# Patient Record
Sex: Female | Born: 1945
Health system: Southern US, Community
[De-identification: ages and names within clinical notes are randomized; demographics above are authoritative.]

## PROBLEM LIST (undated history)

## (undated) DIAGNOSIS — T7840XA Allergy, unspecified, initial encounter: Secondary | ICD-10-CM

## (undated) DIAGNOSIS — I10 Essential (primary) hypertension: Secondary | ICD-10-CM

## (undated) DIAGNOSIS — M1990 Unspecified osteoarthritis, unspecified site: Secondary | ICD-10-CM

## (undated) DIAGNOSIS — R112 Nausea with vomiting, unspecified: Secondary | ICD-10-CM

## (undated) DIAGNOSIS — K219 Gastro-esophageal reflux disease without esophagitis: Secondary | ICD-10-CM

## (undated) DIAGNOSIS — T783XXA Angioneurotic edema, initial encounter: Secondary | ICD-10-CM

## (undated) DIAGNOSIS — M810 Age-related osteoporosis without current pathological fracture: Secondary | ICD-10-CM

## (undated) DIAGNOSIS — L509 Urticaria, unspecified: Secondary | ICD-10-CM

## (undated) DIAGNOSIS — Z9889 Other specified postprocedural states: Secondary | ICD-10-CM

## (undated) HISTORY — DX: Other specified postprocedural states: R11.2

## (undated) HISTORY — PX: CATARACT EXTRACTION, BILATERAL: SHX1313

## (undated) HISTORY — DX: Angioneurotic edema, initial encounter: T78.3XXA

## (undated) HISTORY — DX: Other specified postprocedural states: Z98.890

## (undated) HISTORY — PX: KNEE SURGERY: SHX244

## (undated) HISTORY — DX: Essential (primary) hypertension: I10

## (undated) HISTORY — DX: Unspecified osteoarthritis, unspecified site: M19.90

## (undated) HISTORY — PX: ELBOW SURGERY: SHX618

## (undated) HISTORY — PX: COLONOSCOPY: SHX174

## (undated) HISTORY — DX: Age-related osteoporosis without current pathological fracture: M81.0

## (undated) HISTORY — DX: Urticaria, unspecified: L50.9

## (undated) HISTORY — DX: Allergy, unspecified, initial encounter: T78.40XA

## (undated) HISTORY — DX: Gastro-esophageal reflux disease without esophagitis: K21.9

## (undated) HISTORY — PX: OTHER SURGICAL HISTORY: SHX169

---

## 1998-01-05 ENCOUNTER — Ambulatory Visit (HOSPITAL_COMMUNITY): Admission: RE | Admit: 1998-01-05 | Discharge: 1998-01-05 | Payer: Self-pay | Admitting: Family Medicine

## 1998-01-05 ENCOUNTER — Encounter: Payer: Self-pay | Admitting: Family Medicine

## 2000-08-11 ENCOUNTER — Other Ambulatory Visit: Admission: RE | Admit: 2000-08-11 | Discharge: 2000-08-11 | Payer: Self-pay | Admitting: Obstetrics and Gynecology

## 2000-08-30 ENCOUNTER — Encounter: Admission: RE | Admit: 2000-08-30 | Discharge: 2000-11-28 | Payer: Self-pay | Admitting: Gynecology

## 2000-11-28 ENCOUNTER — Ambulatory Visit (HOSPITAL_COMMUNITY): Admission: RE | Admit: 2000-11-28 | Discharge: 2000-11-28 | Payer: Self-pay | Admitting: Gastroenterology

## 2000-11-28 ENCOUNTER — Encounter: Payer: Self-pay | Admitting: Gastroenterology

## 2000-11-30 ENCOUNTER — Ambulatory Visit (HOSPITAL_COMMUNITY): Admission: RE | Admit: 2000-11-30 | Discharge: 2000-11-30 | Payer: Self-pay | Admitting: Gastroenterology

## 2000-11-30 ENCOUNTER — Encounter: Payer: Self-pay | Admitting: Gastroenterology

## 2001-08-31 ENCOUNTER — Other Ambulatory Visit: Admission: RE | Admit: 2001-08-31 | Discharge: 2001-08-31 | Payer: Self-pay | Admitting: Obstetrics and Gynecology

## 2002-03-20 ENCOUNTER — Ambulatory Visit (HOSPITAL_COMMUNITY): Admission: RE | Admit: 2002-03-20 | Discharge: 2002-03-20 | Payer: Self-pay | Admitting: Family Medicine

## 2002-03-20 ENCOUNTER — Encounter: Payer: Self-pay | Admitting: Family Medicine

## 2002-09-02 ENCOUNTER — Other Ambulatory Visit: Admission: RE | Admit: 2002-09-02 | Discharge: 2002-09-02 | Payer: Self-pay | Admitting: Obstetrics and Gynecology

## 2003-09-04 ENCOUNTER — Other Ambulatory Visit: Admission: RE | Admit: 2003-09-04 | Discharge: 2003-09-04 | Payer: Self-pay | Admitting: Obstetrics and Gynecology

## 2004-09-08 ENCOUNTER — Other Ambulatory Visit: Admission: RE | Admit: 2004-09-08 | Discharge: 2004-09-08 | Payer: Self-pay | Admitting: Obstetrics and Gynecology

## 2005-09-09 ENCOUNTER — Other Ambulatory Visit: Admission: RE | Admit: 2005-09-09 | Discharge: 2005-09-09 | Payer: Self-pay | Admitting: Obstetrics and Gynecology

## 2006-09-12 ENCOUNTER — Other Ambulatory Visit: Admission: RE | Admit: 2006-09-12 | Discharge: 2006-09-12 | Payer: Self-pay | Admitting: Obstetrics and Gynecology

## 2006-09-26 ENCOUNTER — Ambulatory Visit: Payer: Self-pay | Admitting: Gastroenterology

## 2006-10-11 ENCOUNTER — Ambulatory Visit: Payer: Self-pay | Admitting: Gastroenterology

## 2007-09-14 ENCOUNTER — Other Ambulatory Visit: Admission: RE | Admit: 2007-09-14 | Discharge: 2007-09-14 | Payer: Self-pay | Admitting: Obstetrics and Gynecology

## 2008-09-22 ENCOUNTER — Encounter: Payer: Self-pay | Admitting: Obstetrics and Gynecology

## 2008-09-22 ENCOUNTER — Other Ambulatory Visit: Admission: RE | Admit: 2008-09-22 | Discharge: 2008-09-22 | Payer: Self-pay | Admitting: Obstetrics and Gynecology

## 2008-09-22 ENCOUNTER — Ambulatory Visit: Payer: Self-pay | Admitting: Obstetrics and Gynecology

## 2009-09-23 ENCOUNTER — Other Ambulatory Visit: Admission: RE | Admit: 2009-09-23 | Discharge: 2009-09-23 | Payer: Self-pay | Admitting: Obstetrics and Gynecology

## 2009-09-23 ENCOUNTER — Ambulatory Visit: Payer: Self-pay | Admitting: Obstetrics and Gynecology

## 2009-09-30 ENCOUNTER — Ambulatory Visit: Payer: Self-pay | Admitting: Obstetrics and Gynecology

## 2010-09-03 NOTE — Procedures (Signed)
[place]. [facility]  Patient:    Kelly Quinn, Kelly Quinn                      MRN: [id number] Proc. Date: 11/28/00 Adm. Date:  [id number] Attending:  Louie Bun CC:         Stacie Acres. Cliffton Asters, M.D.   Procedure Report  PROCEDURE:  Colonoscopy.  INDICATIONS FOR PROCEDURE:  A 65 year old patient with a family history of colon polyps in a first degree relative.  DESCRIPTION OF PROCEDURE:  The patient was placed in the left lateral decubitus position and placed on the pulse monitor with continuous low-flow oxygen delivered by nasal cannula.  She was sedated with 70 mg IV Demerol and 7 mg IV Versed.  The Olympus video colonoscope was inserted into the rectum and advanced as far as possible.  With the scope inserted completely to its entire length, I was unable to reach the cecum despite multiple torquing maneuvers, abdominal pressure, and position change.  It was unclear, but I felt that the area of most proximal visualization was somewhere in the proximal transverse colon.  That area as well as the remaining transverse, descending, sigmoid, and rectum appeared normal with no polyps, masses, diverticula, or other mucosal abnormalities.  The rectum likewise appeared normal, and retroflex view of the anus revealed no obvious internal hemorrhoids.  The colonoscope was then withdrawn and the patient returned to the recovery room in stable condition.  She tolerated the procedure well, and there were no immediate complications.  IMPRESSION:  Normal incomplete colonoscopy to approximately the mid- to proximal transverse colon.  PLAN:  Will obtain barium enema to visualize the right colon. DD:  11/28/00 TD:  11/28/00 Job: [id number]

## 2010-09-27 ENCOUNTER — Encounter (INDEPENDENT_AMBULATORY_CARE_PROVIDER_SITE_OTHER): Payer: Federal, State, Local not specified - PPO | Admitting: Obstetrics and Gynecology

## 2010-09-27 ENCOUNTER — Other Ambulatory Visit: Payer: Self-pay | Admitting: Obstetrics and Gynecology

## 2010-09-27 ENCOUNTER — Other Ambulatory Visit (HOSPITAL_COMMUNITY)
Admission: RE | Admit: 2010-09-27 | Discharge: 2010-09-27 | Disposition: A | Discharge status: Home / Self Care | Payer: Federal, State, Local not specified - PPO | Source: Ambulatory Visit | Attending: Obstetrics and Gynecology | Admitting: Obstetrics and Gynecology

## 2010-09-27 DIAGNOSIS — Z01419 Encounter for gynecological examination (general) (routine) without abnormal findings: Secondary | ICD-10-CM

## 2010-09-27 DIAGNOSIS — Z124 Encounter for screening for malignant neoplasm of cervix: Secondary | ICD-10-CM | POA: Insufficient documentation

## 2010-09-27 DIAGNOSIS — Z13220 Encounter for screening for lipoid disorders: Secondary | ICD-10-CM

## 2010-09-28 DIAGNOSIS — N760 Acute vaginitis: Secondary | ICD-10-CM | POA: Insufficient documentation

## 2010-10-11 DIAGNOSIS — Z1211 Encounter for screening for malignant neoplasm of colon: Secondary | ICD-10-CM

## 2011-07-13 ENCOUNTER — Encounter: Payer: Self-pay | Admitting: Gastroenterology

## 2011-08-01 ENCOUNTER — Encounter: Payer: Self-pay | Admitting: Gastroenterology

## 2011-08-01 ENCOUNTER — Telehealth: Payer: Self-pay | Admitting: *Deleted

## 2011-08-01 NOTE — Telephone Encounter (Signed)
Pt is calling to have her bone density scheduled, she will be going to [facility]. Pt will need order signed. Pt said that the diag. Should be arthritis? Please advise

## 2011-08-01 NOTE — Telephone Encounter (Signed)
Order faxed.

## 2011-08-01 NOTE — Telephone Encounter (Signed)
We should get a fax from [place] and I will sign it.

## 2011-08-01 NOTE — Telephone Encounter (Signed)
Arthritis is not a reason to do a bone density. RU sure you her correctly?

## 2011-08-01 NOTE — Telephone Encounter (Signed)
I spoke with pt and she said the wrong word, it should be osteoporosis. Pt said that she wrote a lot of information down that day.

## 2011-08-26 ENCOUNTER — Encounter: Payer: Self-pay | Admitting: Obstetrics and Gynecology

## 2011-09-09 ENCOUNTER — Ambulatory Visit
Admission: RE | Admit: 2011-09-09 | Discharge: 2011-09-09 | Disposition: A | Discharge status: Home / Self Care | Payer: PRIVATE HEALTH INSURANCE | Source: Ambulatory Visit | Attending: Cardiology | Admitting: Cardiology

## 2011-09-09 ENCOUNTER — Other Ambulatory Visit: Payer: Self-pay | Admitting: Cardiology

## 2011-09-15 ENCOUNTER — Encounter: Payer: Self-pay | Admitting: Gastroenterology

## 2011-09-20 ENCOUNTER — Ambulatory Visit (AMBULATORY_SURGERY_CENTER): Payer: PRIVATE HEALTH INSURANCE

## 2011-09-20 VITALS — Ht 67.5 in | Wt 196.9 lb

## 2011-09-20 DIAGNOSIS — Z8371 Family history of colonic polyps: Secondary | ICD-10-CM

## 2011-09-20 DIAGNOSIS — Z1211 Encounter for screening for malignant neoplasm of colon: Secondary | ICD-10-CM

## 2011-09-20 MED ORDER — PEG-KCL-NACL-NASULF-NA ASC-C 100 G PO SOLR: 1.0000 | Freq: Once | ORAL | Status: AC

## 2011-09-28 ENCOUNTER — Encounter: Payer: Self-pay | Admitting: Obstetrics and Gynecology

## 2011-09-28 ENCOUNTER — Ambulatory Visit (INDEPENDENT_AMBULATORY_CARE_PROVIDER_SITE_OTHER): Payer: PRIVATE HEALTH INSURANCE | Admitting: Obstetrics and Gynecology

## 2011-09-28 VITALS — BP 124/74 | Ht 67.5 in | Wt 194.0 lb

## 2011-09-28 DIAGNOSIS — Z01419 Encounter for gynecological examination (general) (routine) without abnormal findings: Secondary | ICD-10-CM

## 2011-09-28 NOTE — Progress Notes (Signed)
Patient came to see me today for her annual GYN exam. She is doing well without HRT. She is having no vaginal bleeding. She is having no pelvic pain. She had normal mammogram this year but we did not get the report. She will get me the report. Her bone density showed osteopenia without an elevated FRAX risk. She had some additional bone loss. She has had no fractures. She takes calcium and vitamin D. She had her lab work done by Dr. Donnie Aho when she was having chest pain. She had GERD rather than coronary artery disease. She takes Famvir for HSV 1. She uses boric acid when necessary for vaginitis. She lost her brother due to DVT this year and a sister due to Alzheimer's. She has never had a normal Pap smear.  Physical examination: Kennon Portela present. HEENT within normal limits. Neck: Thyroid not large. No masses. Supraclavicular nodes: not enlarged. Breasts: Examined in both sitting and lying  position. No skin changes and no masses. Abdomen: Soft no guarding rebound or masses or hernia. Pelvic: External: Within normal limits. BUS: Within normal limits. Vaginal:within normal limits. Good estrogen effect. No evidence of cystocele rectocele or enterocele. Cervix: clean. Uterus: Normal size and shape. Adnexa: No masses. Rectovaginal exam: Confirmatory and negative. Extremities: Within normal limits.  Assessment: #1. Osteopenia #2. Recurrent vaginitis  Plan: Patient to me her mammogram report. Continue periodic bone densities. Continue boric acid when necessary.

## 2011-09-29 LAB — URINALYSIS W MICROSCOPIC + REFLEX CULTURE
Bacteria, UA: NONE SEEN
Bilirubin Urine: NEGATIVE
Crystals: NONE SEEN
Glucose, UA: NEGATIVE mg/dL
Ketones, ur: NEGATIVE mg/dL
Specific Gravity, Urine: 1.013 (ref 1.005–1.030)
Urobilinogen, UA: 0.2 mg/dL (ref 0.0–1.0)

## 2011-10-03 ENCOUNTER — Encounter: Payer: Self-pay | Admitting: Obstetrics and Gynecology

## 2011-10-04 ENCOUNTER — Ambulatory Visit (AMBULATORY_SURGERY_CENTER): Payer: PRIVATE HEALTH INSURANCE | Admitting: Gastroenterology

## 2011-10-04 ENCOUNTER — Encounter: Payer: Self-pay | Admitting: Gastroenterology

## 2011-10-04 VITALS — BP 152/79 | HR 82 | Temp 99.2°F | Resp 20 | Ht 67.5 in | Wt 196.0 lb

## 2011-10-04 DIAGNOSIS — Z8371 Family history of colonic polyps: Secondary | ICD-10-CM

## 2011-10-04 DIAGNOSIS — Z1211 Encounter for screening for malignant neoplasm of colon: Secondary | ICD-10-CM

## 2011-10-04 MED ORDER — SODIUM CHLORIDE 0.9 % IV SOLN: 500.0000 mL | INTRAVENOUS | Status: DC

## 2011-10-04 NOTE — Op Note (Signed)
[facility] [address] [place], [place]  [address]  COLONOSCOPY PROCEDURE REPORT  PATIENT:  Kelly Quinn, Kelly Quinn  MR#:  [id number] BIRTHDATE:  [birth year], 65 yrs. old  GENDER:  female ENDOSCOPIST:  Judie Petit T. Russella Dar, MD, [facility]  PROCEDURE DATE:  10/04/2011 PROCEDURE:  Colonoscopy 19147 ASA CLASS:  Class II INDICATIONS:  1) Elevated Risk Screening  2) family hx of polyps: brother and sister MEDICATIONS:   MAC sedation, administered by CRNA, propofol (Diprivan) 300 mg IV DESCRIPTION OF PROCEDURE:   After the risks benefits and alternatives of the procedure were thoroughly explained, informed consent was obtained.  Digital rectal exam was performed and revealed no abnormalities. The LB CF-[id number] endoscope was introduced through the anus and advanced to the cecum, which was identified by both the appendix and ileocecal valve, with a tortuous colon. The quality of the prep was good, using MoviPrep. The instrument was then slowly withdrawn as the colon was fully examined. <<PROCEDUREIMAGES>> FINDINGS:  A normal appearing cecum, ileocecal valve, and appendiceal orifice were identified. The ascending, hepatic flexure, transverse, splenic flexure, descending, sigmoid colon, and rectum appeared unremarkable.   Retroflexed views in the rectum revealed internal hemorrhoids, small.  The time to cecum = 8.67  minutes. The scope was then withdrawn (time =  8.5  min) from the patient and the procedure completed.  COMPLICATIONS:  None  ENDOSCOPIC IMPRESSION: 1) Normal colon 2) Internal hemorrhoids  RECOMMENDATIONS: 1) Repeat Colonoscopy in 5 years.  Venita Lick. Russella Dar, MD, [facility]  CC:  Holley Bouche, MD     Carmelina Peal, MD  n. Rosalie DoctorVenita Lick. Eugenio Dollins at 10/04/2011 09:30 AM  Shearon Stalls, [phone]

## 2011-10-04 NOTE — Patient Instructions (Addendum)
YOU HAD AN ENDOSCOPIC PROCEDURE TODAY AT THE [facility]: Refer to the procedure report that was given to you for any specific questions about what was found during the examination.  If the procedure report does not answer your questions, please call your gastroenterologist to clarify.  If you requested that your care partner not be given the details of your procedure findings, then the procedure report has been included in a sealed envelope for you to review at your convenience later.  YOU SHOULD EXPECT: Some feelings of bloating in the abdomen. Passage of more gas than usual.  Walking can help get rid of the air that was put into your GI tract during the procedure and reduce the bloating. If you had a lower endoscopy (such as a colonoscopy or flexible sigmoidoscopy) you may notice spotting of blood in your stool or on the toilet paper. If you underwent a bowel prep for your procedure, then you may not have a normal bowel movement for a few days.  DIET: Your first meal following the procedure should be a light meal and then it is ok to progress to your normal diet.  A half-sandwich or bowl of soup is an example of a good first meal.  Heavy or fried foods are harder to digest and may make you feel nauseous or bloated.  Likewise meals heavy in dairy and vegetables can cause extra gas to form and this can also increase the bloating.  Drink plenty of fluids but you should avoid alcoholic beverages for 24 hours.  ACTIVITY: Your care partner should take you home directly after the procedure.  You should plan to take it easy, moving slowly for the rest of the day.  You can resume normal activity the day after the procedure however you should NOT DRIVE or use heavy machinery for 24 hours (because of the sedation medicines used during the test).    SYMPTOMS TO REPORT IMMEDIATELY: A gastroenterologist can be reached at any hour.  During normal business hours, 8:30 AM to 5:00 PM Monday through Friday,  call [phone].  After hours and on weekends, please call the GI answering service at [phone] who will take a message and have the physician on call contact you.   Following lower endoscopy (colonoscopy or flexible sigmoidoscopy):  Excessive amounts of blood in the stool  Significant tenderness or worsening of abdominal pains  Swelling of the abdomen that is new, acute  Fever of 100F or higher  Following upper endoscopy (EGD)  Vomiting of blood or coffee ground material  New chest pain or pain under the shoulder blades  Painful or persistently difficult swallowing  New shortness of breath  Fever of 100F or higher  Black, tarry-looking stools  FOLLOW UP: If any biopsies were taken you will be contacted by phone or by letter within the next 1-3 weeks.  Call your gastroenterologist if you have not heard about the biopsies in 3 weeks.  Our staff will call the home number listed on your records the next business day following your procedure to check on you and address any questions or concerns that you may have at that time regarding the information given to you following your procedure. This is a courtesy call and so if there is no answer at the home number and we have not heard from you through the emergency physician on call, we will assume that you have returned to your regular daily activities without incident.  SIGNATURES/CONFIDENTIALITY: You and/or your care   partner have signed paperwork which will be entered into your electronic medical record.  These signatures attest to the fact that that the information above on your After Visit Summary has been reviewed and is understood.  Full responsibility of the confidentiality of this discharge information lies with you and/or your care-partner.  

## 2011-10-04 NOTE — Progress Notes (Signed)
Patient did not experience any of the following events: a burn prior to discharge; a fall within the facility; wrong site/side/patient/procedure/implant event; or a hospital transfer or hospital admission upon discharge from the facility. (G8907) Patient did not have preoperative order for IV antibiotic SSI prophylaxis. (G8918)  

## 2011-10-05 ENCOUNTER — Telehealth: Payer: Self-pay | Admitting: *Deleted

## 2011-10-05 NOTE — Telephone Encounter (Signed)
  Follow up Call-  Call back number 10/04/2011  Post procedure Call Back phone  # [phone]  Permission to leave phone message Yes     Patient questions:  Do you have a fever, pain , or abdominal swelling? no Pain Score  0 *  Have you tolerated food without any problems? yes  Have you been able to return to your normal activities? yes  Do you have any questions about your discharge instructions: Diet   no Medications  no Follow up visit  no  Do you have questions or concerns about your Care? no  Actions: * If pain score is 4 or above: No action needed, pain <4.

## 2011-10-06 ENCOUNTER — Ambulatory Visit: Payer: Federal, State, Local not specified - PPO | Admitting: Cardiology

## 2011-10-12 ENCOUNTER — Other Ambulatory Visit: Payer: Self-pay | Admitting: Dermatology

## 2012-02-15 ENCOUNTER — Telehealth: Payer: Self-pay | Admitting: *Deleted

## 2012-02-15 MED ORDER — NONFORMULARY OR COMPOUNDED ITEM: Status: DC

## 2012-02-15 NOTE — Telephone Encounter (Signed)
Pt is calling requesting new Rx for boric acid 600 mg tablets #40 called into [facility].

## 2012-05-11 ENCOUNTER — Other Ambulatory Visit: Payer: Self-pay | Admitting: Otolaryngology

## 2012-05-11 DIAGNOSIS — K1120 Sialoadenitis, unspecified: Secondary | ICD-10-CM

## 2012-05-11 DIAGNOSIS — K115 Sialolithiasis: Secondary | ICD-10-CM

## 2012-05-16 ENCOUNTER — Inpatient Hospital Stay: Admission: RE | Admit: 2012-05-16 | Payer: PRIVATE HEALTH INSURANCE | Source: Ambulatory Visit

## 2012-05-17 ENCOUNTER — Ambulatory Visit
Admission: RE | Admit: 2012-05-17 | Discharge: 2012-05-17 | Disposition: A | Discharge status: Home / Self Care | Payer: PRIVATE HEALTH INSURANCE | Source: Ambulatory Visit | Attending: Otolaryngology | Admitting: Otolaryngology

## 2012-05-17 DIAGNOSIS — K1120 Sialoadenitis, unspecified: Secondary | ICD-10-CM

## 2012-05-17 DIAGNOSIS — K115 Sialolithiasis: Secondary | ICD-10-CM

## 2012-05-17 MED ORDER — IOHEXOL 300 MG/ML  SOLN
75.0000 mL | Freq: Once | INTRAMUSCULAR | Status: AC | PRN
  Administered 2012-05-17: 75 mL via INTRAVENOUS

## 2012-09-16 HISTORY — PX: SALIVARY STONE REMOVAL: SHX5213

## 2012-10-05 ENCOUNTER — Ambulatory Visit (INDEPENDENT_AMBULATORY_CARE_PROVIDER_SITE_OTHER): Payer: PRIVATE HEALTH INSURANCE | Admitting: Women's Health

## 2012-10-05 ENCOUNTER — Encounter: Payer: Self-pay | Admitting: Women's Health

## 2012-10-05 VITALS — BP 114/70 | Ht 67.5 in | Wt 198.0 lb

## 2012-10-05 DIAGNOSIS — Z833 Family history of diabetes mellitus: Secondary | ICD-10-CM

## 2012-10-05 DIAGNOSIS — M8580 Other specified disorders of bone density and structure, unspecified site: Secondary | ICD-10-CM

## 2012-10-05 DIAGNOSIS — M949 Disorder of cartilage, unspecified: Secondary | ICD-10-CM

## 2012-10-05 DIAGNOSIS — E079 Disorder of thyroid, unspecified: Secondary | ICD-10-CM

## 2012-10-05 DIAGNOSIS — Z13220 Encounter for screening for lipoid disorders: Secondary | ICD-10-CM

## 2012-10-05 DIAGNOSIS — Z01419 Encounter for gynecological examination (general) (routine) without abnormal findings: Secondary | ICD-10-CM

## 2012-10-05 NOTE — Progress Notes (Signed)
Kelly Quinn 17-Jan-1946 [id number]    History:    The patient presents for annual exam.  Postmenopausal on no HRT/no bleeding. Osteoporosis had been on Fosamax 2000 08/18/2008-stopped herself. DEXA 09/2011 Osteopenia AP spine T score -1.8 without increased FRAX. Normal Pap and mammogram history. Negative colonoscopy 2013. Benign salivary gland stone removed last year. Boric acid as needed for vaginitis.   Past medical history, past surgical history, family history and social history were all reviewed and documented in the EPIC chart. Retired [employer]. Daughter 59 doing well. Sister died with Alzheimer's. Brother died with DVT. Brother with colon polyps and diabetes. Mother sister hypertension.   ROS:  A  ROS was performed and pertinent positives and negatives are included in the history.  Exam:  Filed Vitals:   10/05/12 0800  BP: 114/70    General appearance:  Normal Head/Neck:  Normal, without cervical or supraclavicular adenopathy. Thyroid:  Symmetrical, normal in size, without palpable masses or nodularity. Respiratory  Effort:  Normal  Auscultation:  Clear without wheezing or rhonchi Cardiovascular  Auscultation:  Regular rate, without rubs, murmurs or gallops  Edema/varicosities:  Not grossly evident Abdominal  Soft,nontender, without masses, guarding or rebound.  Liver/spleen:  No organomegaly noted  Hernia:  None appreciated  Skin  Inspection:  Grossly normal  Palpation:  Grossly normal Neurologic/psychiatric  Orientation:  Normal with appropriate conversation.  Mood/affect:  Normal  Genitourinary    Breasts: Examined lying and sitting.     Right: Without masses, retractions, discharge or axillary adenopathy.     Left: Without masses, retractions, discharge or axillary adenopathy.   Inguinal/mons:  Normal without inguinal adenopathy  External genitalia:  Normal  BUS/Urethra/Skene's glands:  Normal  Bladder:  Normal  Vagina:  Normal  Cervix:  Normal  Uterus:    normal in size, shape and contour.  Midline and mobile  Adnexa/parametria:     Rt: Without masses or tenderness.   Lt: Without masses or tenderness.  Anus and perineum: Normal  Digital rectal exam: Normal sphincter tone without palpated masses or tenderness  Assessment/Plan:  67 y.o. WBF G1P1  for annual exam.     Ppostmenopausal exam Osteopenia Rare vaginitis/Boric acid/ occasional use  Plan: SBE's, continue annual mammogram, reviewed breast tissue dense encourage 3-D tomography. Reviewed importance of increasing regular exercise and decreasing calories for weight loss for health. CBC, glucose, lipid panel, TSH, UA, Pap. Pap normal 2012, new screening guidelines reviewed. Hemoccult card given with instructions. Home safety and fall prevention discussed. Condoms encouraged if becomes sexually active.   Harrington Challenger WHNP, 2:02 PM 10/05/2012

## 2012-10-05 NOTE — Patient Instructions (Addendum)
Health Recommendations for Postmenopausal Women Respected and ongoing research has looked at the most common causes of death, disability, and poor quality of life in postmenopausal women. The causes include heart disease, diseases of blood vessels, diabetes, depression, cancer, and bone loss (osteoporosis). Many things can be done to help lower the chances of developing these and other common problems: CARDIOVASCULAR DISEASE Heart Disease: A heart attack is a medical emergency. Know the signs and symptoms of a heart attack. Below are things women can do to reduce their risk for heart disease.   Do not smoke. If you smoke, quit.  Aim for a healthy weight. Being overweight causes many preventable deaths. Eat a healthy and balanced diet and drink an adequate amount of liquids.  Get moving. Make a commitment to be more physically active. Aim for 30 minutes of activity on most, if not all days of the week.  Eat for heart health. Choose a diet that is low in saturated fat and cholesterol and eliminate trans fat. Include whole grains, vegetables, and fruits. Read and understand the labels on food containers before buying.  Know your numbers. Ask your caregiver to check your blood pressure, cholesterol (total, HDL, LDL, triglycerides) and blood glucose. Work with your caregiver on improving your entire clinical picture.  High blood pressure. Limit or stop your table salt intake (try salt substitute and food seasonings). Avoid salty foods and drinks. Read labels on food containers before buying. Eating well and exercising can help control high blood pressure. STROKE  Stroke is a medical emergency. Stroke may be the result of a blood clot in a blood vessel in the brain or by a brain hemorrhage (bleeding). Know the signs and symptoms of a stroke. To lower the risk of developing a stroke:  Avoid fatty foods.  Quit smoking.  Control your diabetes, blood pressure, and irregular heart rate. THROMBOPHLEBITIS  (BLOOD CLOT) OF THE LEG  Becoming overweight and leading a stationary lifestyle may also contribute to developing blood clots. Controlling your diet and exercising will help lower the risk of developing blood clots. CANCER SCREENING  Breast Cancer: Take steps to reduce your risk of breast cancer.  You should practice "breast self-awareness." This means understanding the normal appearance and feel of your breasts and should include breast self-examination. Any changes detected, no matter how small, should be reported to your caregiver.  After age 40, you should have a clinical breast exam (CBE) every year.  Starting at age 40, you should consider having a mammogram (breast X-ray) every year.  If you have a family history of breast cancer, talk to your caregiver about genetic screening.  If you are at high risk for breast cancer, talk to your caregiver about having an MRI and a mammogram every year.  Intestinal or Stomach Cancer: Tests to consider are a rectal exam, fecal occult blood, sigmoidoscopy, and colonoscopy. Women who are high risk may need to be screened at an earlier age and more often.  Cervical Cancer:  Beginning at age 30, you should have a Pap test every 3 years as long as the past 3 Pap tests have been normal.  If you have had past treatment for cervical cancer or a condition that could lead to cancer, you need Pap tests and screening for cancer for at least 20 years after your treatment.  If you had a hysterectomy for a problem that was not cancer or a condition that could lead to cancer, then you no longer need Pap tests.    If you are between ages 65 and 70, and you have had normal Pap tests going back 10 years, you no longer need Pap tests.  If Pap tests have been discontinued, risk factors (such as a new sexual partner) need to be reassessed to determine if screening should be resumed.  Some medical problems can increase the chance of getting cervical cancer. In these  cases, your caregiver may recommend more frequent screening and Pap tests.  Uterine Cancer: If you have vaginal bleeding after reaching menopause, you should notify your caregiver.  Ovarian cancer: Other than yearly pelvic exams, there are no reliable tests available to screen for ovarian cancer at this time except for yearly pelvic exams.  Lung Cancer: Yearly chest X-rays can detect lung cancer and should be done on high risk women, such as cigarette smokers and women with chronic lung disease (emphysema).  Skin Cancer: A complete body skin exam should be done at your yearly examination. Avoid overexposure to the sun and ultraviolet light lamps. Use a strong sun block cream when in the sun. All of these things are important in lowering the risk of skin cancer. MENOPAUSE Menopause Symptoms: Hormone therapy products are effective for treating symptoms associated with menopause:  Moderate to severe hot flashes.  Night sweats.  Mood swings.  Headaches.  Tiredness.  Loss of sex drive.  Insomnia.  Other symptoms. Hormone replacement carries certain risks, especially in older women. Women who use or are thinking about using estrogen or estrogen with progestin treatments should discuss that with their caregiver. Your caregiver will help you understand the benefits and risks. The ideal dose of hormone replacement therapy is not known. The Food and Drug Administration (FDA) has concluded that hormone therapy should be used only at the lowest doses and for the shortest amount of time to reach treatment goals.  OSTEOPOROSIS Protecting Against Bone Loss and Preventing Fracture: If you use hormone therapy for prevention of bone loss (osteoporosis), the risks for bone loss must outweigh the risk of the therapy. Ask your caregiver about other medications known to be safe and effective for preventing bone loss and fractures. To guard against bone loss or fractures, the following is recommended:  If  you are less than age 50, take 1000 mg of calcium and at least 600 mg of Vitamin D per day.  If you are greater than age 50 but less than age 70, take 1200 mg of calcium and at least 600 mg of Vitamin D per day.  If you are greater than age 70, take 1200 mg of calcium and at least 800 mg of Vitamin D per day. Smoking and excessive alcohol intake increases the risk of osteoporosis. Eat foods rich in calcium and vitamin D and do weight bearing exercises several times a week as your caregiver suggests. DIABETES Diabetes Melitus: If you have Type I or Type 2 diabetes, you should keep your blood sugar under control with diet, exercise and recommended medication. Avoid too many sweets, starchy and fatty foods. Being overweight can make control more difficult. COGNITION AND MEMORY Cognition and Memory: Menopausal hormone therapy is not recommended for the prevention of cognitive disorders such as Alzheimer's disease or memory loss.  DEPRESSION  Depression may occur at any age, but is common in elderly women. The reasons may be because of physical, medical, social (loneliness), or financial problems and needs. If you are experiencing depression because of medical problems and control of symptoms, talk to your caregiver about this. Physical activity and   exercise may help with mood and sleep. Community and volunteer involvement may help your sense of value and worth. If you have depression and you feel that the problem is getting worse or becoming severe, talk to your caregiver about treatment options that are best for you. ACCIDENTS  Accidents are common and can be serious in the elderly woman. Prepare your house to prevent accidents. Eliminate throw rugs, place hand bars in the bath, shower and toilet areas. Avoid wearing high heeled shoes or walking on wet, snowy, and icy areas. Limit or stop driving if you have vision or hearing problems, or you feel you are unsteady with you movements and  reflexes. HEPATITIS C Hepatitis C is a type of viral infection affecting the liver. It is spread mainly through contact with blood from an infected person. It can be treated, but if left untreated, it can lead to severe liver damage over years. Many people who are infected do not know that the virus is in their blood. If you are a "baby-boomer", it is recommended that you have one screening test for Hepatitis C. IMMUNIZATIONS  Several immunizations are important to consider having during your senior years, including:   Tetanus, diptheria, and pertussis booster shot.  Influenza every year before the flu season begins.  Pneumonia vaccine.  Shingles vaccine.  Others as indicated based on your specific needs. Talk to your caregiver about these. Document Released: 05/27/2005 Document Revised: 03/21/2012 Document Reviewed: 01/21/2008 Exit[employer] Patient Information 2014 [employer].  

## 2012-10-08 ENCOUNTER — Encounter: Payer: Self-pay | Admitting: Women's Health

## 2012-10-11 ENCOUNTER — Encounter: Payer: Self-pay | Admitting: Women's Health

## 2012-10-12 ENCOUNTER — Encounter: Payer: Self-pay | Admitting: Obstetrics and Gynecology

## 2012-10-31 ENCOUNTER — Telehealth: Payer: Self-pay | Admitting: *Deleted

## 2012-10-31 NOTE — Telephone Encounter (Signed)
Pt informed with lab result on OV 10/05/12.

## 2012-11-08 ENCOUNTER — Encounter: Payer: Self-pay | Admitting: Women's Health

## 2013-02-21 ENCOUNTER — Other Ambulatory Visit: Payer: Self-pay

## 2013-08-21 ENCOUNTER — Ambulatory Visit
Admission: RE | Admit: 2013-08-21 | Discharge: 2013-08-21 | Disposition: A | Discharge status: Home / Self Care | Payer: Commercial Managed Care - HMO | Source: Ambulatory Visit | Attending: Family Medicine | Admitting: Family Medicine

## 2013-08-21 ENCOUNTER — Other Ambulatory Visit: Payer: Self-pay | Admitting: Family Medicine

## 2013-08-21 DIAGNOSIS — M79609 Pain in unspecified limb: Secondary | ICD-10-CM

## 2013-08-27 ENCOUNTER — Ambulatory Visit (INDEPENDENT_AMBULATORY_CARE_PROVIDER_SITE_OTHER): Payer: Commercial Managed Care - HMO | Admitting: Women's Health

## 2013-08-27 ENCOUNTER — Encounter: Payer: Self-pay | Admitting: Women's Health

## 2013-08-27 DIAGNOSIS — L293 Anogenital pruritus, unspecified: Secondary | ICD-10-CM

## 2013-08-27 DIAGNOSIS — R300 Dysuria: Secondary | ICD-10-CM

## 2013-08-27 DIAGNOSIS — N898 Other specified noninflammatory disorders of vagina: Secondary | ICD-10-CM

## 2013-08-27 LAB — URINALYSIS W MICROSCOPIC + REFLEX CULTURE
BILIRUBIN URINE: NEGATIVE
CASTS: NONE SEEN
Crystals: NONE SEEN
Glucose, UA: NEGATIVE mg/dL
KETONES UR: NEGATIVE mg/dL
Leukocytes, UA: NEGATIVE
Nitrite: NEGATIVE
PH: 6 (ref 5.0–8.0)
Protein, ur: NEGATIVE mg/dL
Specific Gravity, Urine: 1.015 (ref 1.005–1.030)
Urobilinogen, UA: 0.2 mg/dL (ref 0.0–1.0)

## 2013-08-27 LAB — WET PREP FOR TRICH, YEAST, CLUE
Clue Cells Wet Prep HPF POC: NONE SEEN
Trich, Wet Prep: NONE SEEN
Yeast Wet Prep HPF POC: NONE SEEN

## 2013-08-27 MED ORDER — FLUCONAZOLE 150 MG PO TABS: 150.0000 mg | ORAL_TABLET | Freq: Once | ORAL | Status: DC

## 2013-08-27 MED ORDER — SULFAMETHOXAZOLE-TRIMETHOPRIM 800-160 MG PO TABS: 1.0000 | ORAL_TABLET | Freq: Two times a day (BID) | ORAL | Status: DC

## 2013-08-27 NOTE — Progress Notes (Signed)
Patient ID: COLLINS DIMARIA, female   DOB: 07-05-1945, 68 y.o.   MRN: [id number]  Presents with complaint of vaginal burning/stinging sensation end of stream of urination and vaginal irritation. Not sexually active. Has been exercising regularly and noticed after. Denies abdominal pain, back pain, fever.  Exam: Appears well. External genitalia slight erythema, speculum exam white discharge without odor, vaginal vault erythematous, wet prep negative. UA: Trace blood, and fear did 2 RBCs, moderate bacteria.  UTI and yeast  Plan: Septra twice daily for 3 days, Diflucan 150 by mouth times one dose. Instructed to call if no relief of symptoms. Instructed to continue to exercise, change clothes after exercise. Instructed to call if no relief of symptoms. Urine culture pending.

## 2013-08-28 LAB — URINE CULTURE
Colony Count: NO GROWTH
Organism ID, Bacteria: NO GROWTH

## 2013-08-29 ENCOUNTER — Encounter: Payer: Self-pay | Admitting: Women's Health

## 2013-09-02 ENCOUNTER — Encounter: Payer: Self-pay | Admitting: Gynecology

## 2013-09-04 ENCOUNTER — Encounter: Payer: Self-pay | Admitting: Gynecology

## 2013-09-10 ENCOUNTER — Encounter: Payer: Self-pay | Admitting: Gynecology

## 2013-10-15 ENCOUNTER — Ambulatory Visit (INDEPENDENT_AMBULATORY_CARE_PROVIDER_SITE_OTHER): Payer: Commercial Managed Care - HMO | Admitting: Women's Health

## 2013-10-15 ENCOUNTER — Encounter: Payer: Self-pay | Admitting: Women's Health

## 2013-10-15 VITALS — BP 140/82 | Ht 67.0 in | Wt 199.8 lb

## 2013-10-15 DIAGNOSIS — M899 Disorder of bone, unspecified: Secondary | ICD-10-CM

## 2013-10-15 DIAGNOSIS — M8580 Other specified disorders of bone density and structure, unspecified site: Secondary | ICD-10-CM

## 2013-10-15 DIAGNOSIS — M949 Disorder of cartilage, unspecified: Secondary | ICD-10-CM

## 2013-10-15 NOTE — Progress Notes (Signed)
Kelly Quinn 04/30/1945 [id number]    History:    Presents for breast and pelvic exam.  Postmenopausal on no HRT with no bleeding. Not sexually active. History of osteopenia had been on Fosamax for approximately 3 -4 years stopped 2010. Dexa stable 08/2013, T score -1.8 at [facility] and no increased fracture risk. Normal Pap and mammogram history. 2013 negative colonoscopy. Current on vaccines. Reports blood pressure occasionally high/change.  Past medical history, past surgical history, family history and social history were all reviewed and documented in the EPIC chart. Retired [employer]. One daughter, 3 grandchildren. Mother, sister hypertension brother DVT. Sister Alzheimer's.  ROS:  A  12 point ROS was performed and pertinent positives and negatives are included.  Exam:  Filed Vitals:   10/15/13 0822  BP: 140/82    General appearance:  Normal Thyroid:  Symmetrical, normal in size, without palpable masses or nodularity. Respiratory  Auscultation:  Clear without wheezing or rhonchi Cardiovascular  Auscultation:  Regular rate, without rubs, murmurs or gallops  Edema/varicosities:  Not grossly evident Abdominal  Soft,nontender, without masses, guarding or rebound.  Liver/spleen:  No organomegaly noted  Hernia:  None appreciated  Skin  Inspection:  Grossly normal   Breasts: Examined lying and sitting.     Right: Without masses, retractions, discharge or axillary adenopathy.     Left: Without masses, retractions, discharge or axillary adenopathy. Gentitourinary   Inguinal/mons:  Normal without inguinal adenopathy  External genitalia:  Normal  BUS/Urethra/Skene's glands:  Normal  Vagina:  Normal  Cervix:  Normal  Uterus:  normal in size, shape and contour.  Midline and mobile  Adnexa/parametria:     Rt: Without masses or tenderness.   Lt: Without masses or tenderness.  Anus and perineum: Normal  Digital rectal exam: Normal sphincter tone without palpated masses or  tenderness  Assessment/Plan:  68 y.o. WBF G1P1 for breast pelvic exam.  Osteopenia - stable, (fosamax Stopped 2010) Labs primary care Borderline blood pressure  Plan: Instructed to followup with primary care for blood pressure. SBE's, continue annual mammogram, 3D tomography reviewed and encouraged history of dense breast. Continue regular exercise, currently exercising 5 times weekly with various classes and walking. Pap normal 2014, new screening guidelines reviewed. Home safety and fall prevention discussed.  Note: This dictation was prepared with Dragon/digital dictation.  Any transcriptional errors that result are unintentional. Huel Cote WHNP, 9:00 AM 10/15/2013

## 2013-10-15 NOTE — Patient Instructions (Signed)
Health Recommendations for Postmenopausal Women Respected and ongoing research has looked at the most common causes of death, disability, and poor quality of life in postmenopausal women. The causes include heart disease, diseases of blood vessels, diabetes, depression, cancer, and bone loss (osteoporosis). Many things can be done to help lower the chances of developing these and other common problems: CARDIOVASCULAR DISEASE Heart Disease: A heart attack is a medical emergency. Know the signs and symptoms of a heart attack. Below are things women can do to reduce their risk for heart disease.   Do not smoke. If you smoke, quit.  Aim for a healthy weight. Being overweight causes many preventable deaths. Eat a healthy and balanced diet and drink an adequate amount of liquids.  Get moving. Make a commitment to be more physically active. Aim for 30 minutes of activity on most, if not all days of the week.  Eat for heart health. Choose a diet that is low in saturated fat and cholesterol and eliminate trans fat. Include whole grains, vegetables, and fruits. Read and understand the labels on food containers before buying.  Know your numbers. Ask your caregiver to check your blood pressure, cholesterol (total, HDL, LDL, triglycerides) and blood glucose. Work with your caregiver on improving your entire clinical picture.  High blood pressure. Limit or stop your table salt intake (try salt substitute and food seasonings). Avoid salty foods and drinks. Read labels on food containers before buying. Eating well and exercising can help control high blood pressure. STROKE  Stroke is a medical emergency. Stroke may be the result of a blood clot in a blood vessel in the brain or by a brain hemorrhage (bleeding). Know the signs and symptoms of a stroke. To lower the risk of developing a stroke:  Avoid fatty foods.  Quit smoking.  Control your diabetes, blood pressure, and irregular heart rate. THROMBOPHLEBITIS  (BLOOD CLOT) OF THE LEG  Becoming overweight and leading a stationary lifestyle may also contribute to developing blood clots. Controlling your diet and exercising will help lower the risk of developing blood clots. CANCER SCREENING  Breast Cancer: Take steps to reduce your risk of breast cancer.  You should practice "breast self-awareness." This means understanding the normal appearance and feel of your breasts and should include breast self-examination. Any changes detected, no matter how small, should be reported to your caregiver.  After age 40, you should have a clinical breast exam (CBE) every year.  Starting at age 40, you should consider having a mammogram (breast X-ray) every year.  If you have a family history of breast cancer, talk to your caregiver about genetic screening.  If you are at high risk for breast cancer, talk to your caregiver about having an MRI and a mammogram every year.  Intestinal or Stomach Cancer: Tests to consider are a rectal exam, fecal occult blood, sigmoidoscopy, and colonoscopy. Women who are high risk may need to be screened at an earlier age and more often.  Cervical Cancer:  Beginning at age 30, you should have a Pap test every 3 years as long as the past 3 Pap tests have been normal.  If you have had past treatment for cervical cancer or a condition that could lead to cancer, you need Pap tests and screening for cancer for at least 20 years after your treatment.  If you had a hysterectomy for a problem that was not cancer or a condition that could lead to cancer, then you no longer need Pap tests.    If you are between ages 65 and 70, and you have had normal Pap tests going back 10 years, you no longer need Pap tests.  If Pap tests have been discontinued, risk factors (such as a new sexual partner) need to be reassessed to determine if screening should be resumed.  Some medical problems can increase the chance of getting cervical cancer. In these  cases, your caregiver may recommend more frequent screening and Pap tests.  Uterine Cancer: If you have vaginal bleeding after reaching menopause, you should notify your caregiver.  Ovarian cancer: Other than yearly pelvic exams, there are no reliable tests available to screen for ovarian cancer at this time except for yearly pelvic exams.  Lung Cancer: Yearly chest X-rays can detect lung cancer and should be done on high risk women, such as cigarette smokers and women with chronic lung disease (emphysema).  Skin Cancer: A complete body skin exam should be done at your yearly examination. Avoid overexposure to the sun and ultraviolet light lamps. Use a strong sun block cream when in the sun. All of these things are important in lowering the risk of skin cancer. MENOPAUSE Menopause Symptoms: Hormone therapy products are effective for treating symptoms associated with menopause:  Moderate to severe hot flashes.  Night sweats.  Mood swings.  Headaches.  Tiredness.  Loss of sex drive.  Insomnia.  Other symptoms. Hormone replacement carries certain risks, especially in older women. Women who use or are thinking about using estrogen or estrogen with progestin treatments should discuss that with their caregiver. Your caregiver will help you understand the benefits and risks. The ideal dose of hormone replacement therapy is not known. The Food and Drug Administration (FDA) has concluded that hormone therapy should be used only at the lowest doses and for the shortest amount of time to reach treatment goals.  OSTEOPOROSIS Protecting Against Bone Loss and Preventing Fracture: If you use hormone therapy for prevention of bone loss (osteoporosis), the risks for bone loss must outweigh the risk of the therapy. Ask your caregiver about other medications known to be safe and effective for preventing bone loss and fractures. To guard against bone loss or fractures, the following is recommended:  If  you are less than age 50, take 1000 mg of calcium and at least 600 mg of Vitamin D per day.  If you are greater than age 50 but less than age 70, take 1200 mg of calcium and at least 600 mg of Vitamin D per day.  If you are greater than age 70, take 1200 mg of calcium and at least 800 mg of Vitamin D per day. Smoking and excessive alcohol intake increases the risk of osteoporosis. Eat foods rich in calcium and vitamin D and do weight bearing exercises several times a week as your caregiver suggests. DIABETES Diabetes Melitus: If you have Type I or Type 2 diabetes, you should keep your blood sugar under control with diet, exercise and recommended medication. Avoid too many sweets, starchy and fatty foods. Being overweight can make control more difficult. COGNITION AND MEMORY Cognition and Memory: Menopausal hormone therapy is not recommended for the prevention of cognitive disorders such as Alzheimer's disease or memory loss.  DEPRESSION  Depression may occur at any age, but is common in elderly women. The reasons may be because of physical, medical, social (loneliness), or financial problems and needs. If you are experiencing depression because of medical problems and control of symptoms, talk to your caregiver about this. Physical activity and   exercise may help with mood and sleep. Community and volunteer involvement may help your sense of value and worth. If you have depression and you feel that the problem is getting worse or becoming severe, talk to your caregiver about treatment options that are best for you. ACCIDENTS  Accidents are common and can be serious in the elderly woman. Prepare your house to prevent accidents. Eliminate throw rugs, place hand bars in the bath, shower and toilet areas. Avoid wearing high heeled shoes or walking on wet, snowy, and icy areas. Limit or stop driving if you have vision or hearing problems, or you feel you are unsteady with you movements and  reflexes. HEPATITIS C Hepatitis C is a type of viral infection affecting the liver. It is spread mainly through contact with blood from an infected person. It can be treated, but if left untreated, it can lead to severe liver damage over years. Many people who are infected do not know that the virus is in their blood. If you are a "baby-boomer", it is recommended that you have one screening test for Hepatitis C. IMMUNIZATIONS  Several immunizations are important to consider having during your senior years, including:   Tetanus, diptheria, and pertussis booster shot.  Influenza every year before the flu season begins.  Pneumonia vaccine.  Shingles vaccine.  Others as indicated based on your specific needs. Talk to your caregiver about these. Document Released: 05/27/2005 Document Revised: 03/21/2012 Document Reviewed: 01/21/2008 [facility] Patient Information 2015 [place], [place]. This information is not intended to replace advice given to you by your health care provider. Make sure you discuss any questions you have with your health care provider.

## 2013-11-26 ENCOUNTER — Other Ambulatory Visit: Payer: Self-pay | Admitting: Women's Health

## 2014-01-23 ENCOUNTER — Ambulatory Visit (INDEPENDENT_AMBULATORY_CARE_PROVIDER_SITE_OTHER): Payer: Commercial Managed Care - HMO | Admitting: Women's Health

## 2014-01-23 ENCOUNTER — Encounter: Payer: Self-pay | Admitting: Women's Health

## 2014-01-23 VITALS — BP 140/80 | Wt 202.0 lb

## 2014-01-23 DIAGNOSIS — N898 Other specified noninflammatory disorders of vagina: Secondary | ICD-10-CM

## 2014-01-23 LAB — WET PREP FOR TRICH, YEAST, CLUE
Clue Cells Wet Prep HPF POC: NONE SEEN
TRICH WET PREP: NONE SEEN
WBC, Wet Prep HPF POC: NONE SEEN
Yeast Wet Prep HPF POC: NONE SEEN

## 2014-01-23 NOTE — Progress Notes (Signed)
Patient ID: Kelly Quinn, female   DOB: 03/10/1946, 68 y.o.   MRN: [id number] Presents with complaint of vaginal discharge with odor for the past several days, states it is better today. Denies any urinary symptoms, abdominal pain or fever. Postmenopausal on no HRT with no bleeding. Not sexually active.  Exam: Appears well. External genitalia within normal limits, speculum exam scant discharge no erythema or odor noted wet prep negative. Bimanual no CMT or adnexal fullness or tenderness.  Normal discharge  Plan: Reassurance giving regarding normality of exam and wet prep.

## 2014-05-07 DIAGNOSIS — L309 Dermatitis, unspecified: Secondary | ICD-10-CM | POA: Diagnosis not present

## 2014-05-07 DIAGNOSIS — K130 Diseases of lips: Secondary | ICD-10-CM | POA: Diagnosis not present

## 2014-05-07 DIAGNOSIS — G5762 Lesion of plantar nerve, left lower limb: Secondary | ICD-10-CM | POA: Diagnosis not present

## 2014-06-03 DIAGNOSIS — J309 Allergic rhinitis, unspecified: Secondary | ICD-10-CM | POA: Diagnosis not present

## 2014-06-03 DIAGNOSIS — T783XXA Angioneurotic edema, initial encounter: Secondary | ICD-10-CM | POA: Diagnosis not present

## 2014-06-03 DIAGNOSIS — T782XXA Anaphylactic shock, unspecified, initial encounter: Secondary | ICD-10-CM | POA: Diagnosis not present

## 2014-06-03 DIAGNOSIS — T7800XA Anaphylactic reaction due to unspecified food, initial encounter: Secondary | ICD-10-CM | POA: Diagnosis not present

## 2014-06-23 DIAGNOSIS — T783XXA Angioneurotic edema, initial encounter: Secondary | ICD-10-CM | POA: Diagnosis not present

## 2014-06-23 DIAGNOSIS — J309 Allergic rhinitis, unspecified: Secondary | ICD-10-CM | POA: Diagnosis not present

## 2014-06-24 DIAGNOSIS — G5762 Lesion of plantar nerve, left lower limb: Secondary | ICD-10-CM | POA: Diagnosis not present

## 2014-06-24 DIAGNOSIS — G5761 Lesion of plantar nerve, right lower limb: Secondary | ICD-10-CM | POA: Diagnosis not present

## 2014-07-09 DIAGNOSIS — G5762 Lesion of plantar nerve, left lower limb: Secondary | ICD-10-CM | POA: Diagnosis not present

## 2014-07-09 DIAGNOSIS — G5761 Lesion of plantar nerve, right lower limb: Secondary | ICD-10-CM | POA: Diagnosis not present

## 2014-08-04 DIAGNOSIS — H5213 Myopia, bilateral: Secondary | ICD-10-CM | POA: Diagnosis not present

## 2014-08-04 DIAGNOSIS — Z0100 Encounter for examination of eyes and vision without abnormal findings: Secondary | ICD-10-CM | POA: Diagnosis not present

## 2014-08-04 DIAGNOSIS — H5210 Myopia, unspecified eye: Secondary | ICD-10-CM | POA: Diagnosis not present

## 2014-08-13 DIAGNOSIS — G5762 Lesion of plantar nerve, left lower limb: Secondary | ICD-10-CM | POA: Diagnosis not present

## 2014-08-13 DIAGNOSIS — G5761 Lesion of plantar nerve, right lower limb: Secondary | ICD-10-CM | POA: Diagnosis not present

## 2014-08-13 DIAGNOSIS — M7671 Peroneal tendinitis, right leg: Secondary | ICD-10-CM | POA: Diagnosis not present

## 2014-09-01 DIAGNOSIS — Z803 Family history of malignant neoplasm of breast: Secondary | ICD-10-CM | POA: Diagnosis not present

## 2014-09-01 DIAGNOSIS — Z1231 Encounter for screening mammogram for malignant neoplasm of breast: Secondary | ICD-10-CM | POA: Diagnosis not present

## 2014-09-02 ENCOUNTER — Encounter: Payer: Self-pay | Admitting: Gynecology

## 2014-09-02 DIAGNOSIS — R030 Elevated blood-pressure reading, without diagnosis of hypertension: Secondary | ICD-10-CM | POA: Diagnosis not present

## 2014-09-02 DIAGNOSIS — M2660 Temporomandibular joint disorder, unspecified: Secondary | ICD-10-CM | POA: Diagnosis not present

## 2014-09-02 DIAGNOSIS — T783XXD Angioneurotic edema, subsequent encounter: Secondary | ICD-10-CM | POA: Diagnosis not present

## 2014-09-02 DIAGNOSIS — R600 Localized edema: Secondary | ICD-10-CM | POA: Diagnosis not present

## 2014-09-10 DIAGNOSIS — G5762 Lesion of plantar nerve, left lower limb: Secondary | ICD-10-CM | POA: Diagnosis not present

## 2014-09-10 DIAGNOSIS — G5761 Lesion of plantar nerve, right lower limb: Secondary | ICD-10-CM | POA: Diagnosis not present

## 2014-10-13 ENCOUNTER — Other Ambulatory Visit: Payer: Self-pay

## 2014-10-17 ENCOUNTER — Encounter: Payer: Self-pay | Admitting: Women's Health

## 2014-10-17 ENCOUNTER — Ambulatory Visit (INDEPENDENT_AMBULATORY_CARE_PROVIDER_SITE_OTHER): Payer: PRIVATE HEALTH INSURANCE | Admitting: Women's Health

## 2014-10-17 VITALS — BP 130/80 | Ht 67.0 in | Wt 197.0 lb

## 2014-10-17 DIAGNOSIS — Z01419 Encounter for gynecological examination (general) (routine) without abnormal findings: Secondary | ICD-10-CM

## 2014-10-17 DIAGNOSIS — L298 Other pruritus: Secondary | ICD-10-CM

## 2014-10-17 DIAGNOSIS — N898 Other specified noninflammatory disorders of vagina: Secondary | ICD-10-CM

## 2014-10-17 LAB — WET PREP FOR TRICH, YEAST, CLUE
Clue Cells Wet Prep HPF POC: NONE SEEN
TRICH WET PREP: NONE SEEN
YEAST WET PREP: NONE SEEN

## 2014-10-17 MED ORDER — TERCONAZOLE 0.4 % VA CREA: 1.0000 | TOPICAL_CREAM | Freq: Every day | VAGINAL | Status: DC

## 2014-10-17 NOTE — Progress Notes (Signed)
STARLETTE THUROW [birth year] [id number]   History:    Presents for annual exam. Complains of intermittent external vaginal itching, with mild discharge. Comes and goes. Denies vaginal odor, stinging, burning, or pain. Postmenopausal/no HRT/no bleeding/not sexually active. Recent angioedema of the face due to possible aspirin allergy. Normal Pap and mammogram history. Osteopenia, 201 DEXA T score -1.8 without increased FRAX. 2013 Colonoscopy negative. Exercises 4 days a week with Silver Sneakers/2lb weight loss from last visit.  Past medical history, past surgical history, family history and social history were all reviewed and documented in the EPIC chart. Retired [employer]. Daughter 55, doing well. Grandchildren ages 69, 66, 59. Sister diagnosed in 07/2014 stage I breast cancer. Sister died of Alzheimer's disease. Brother died of DVT. Brother with colon polyps and diabetes. Family history of hypertension and diabetes. Volunteers weekly at [employer].  ROS:  A ROS was performed and pertinent positives and negatives are included.  Exam:  Filed Vitals:   10/17/14 0823  BP: 130/80    General appearance:  Normal Thyroid:  Symmetrical, normal in size, without palpable masses or nodularity. Respiratory  Auscultation:  Clear without wheezing or rhonchi Cardiovascular  Auscultation:  Regular rate, without rubs, murmurs or gallops  Edema/varicosities:  Not grossly evident Abdominal  Soft,nontender, without masses, guarding or rebound.  Liver/spleen:  No organomegaly noted  Hernia:  None appreciated  Skin  Inspection:  Grossly normal   Breasts: Examined lying and sitting.     Right: Without masses, retractions, discharge or axillary adenopathy.     Left: Without masses, retractions, discharge or axillary adenopathy. Gentitourinary   Inguinal/mons:  Normal without inguinal adenopathy  External genitalia:  Normal  BUS/Urethra/Skene's glands:  Normal  Vagina:  Normal  Cervix:  Normal  Uterus:   normal in size, shape and contour.  Midline and mobile  Adnexa/parametria:     Rt: Without masses or tenderness.   Lt: Without masses or tenderness.  Anus and perineum: Normal  Digital rectal exam: Normal sphincter tone without palpated masses or tenderness  Assessment/Plan:  69 y.o. WBF G1P1 for annual exam.  Complains of vaginal itching.  Vaginal itching-wet prep negative Postmenopausal/no HRT/no bleeding Osteopenia/2013 DEXA T score -1.8 Fosamax 2005-2010 HSV-1 rare outbreaks labs primary care   Plan: No Pap,, New guidelines and screening recommendations reviewed. Continue SBE's and annual mammograms. Terazol 45 g, one applicator externally when necessary. Continue vit D, calcium, vit C, MVI, regular exercise, heart healthy diet. Condom use encouraged if sexually active. UA only. Safety, fall prevention and importance of regular weightbearing exercise reviewed.   [place], 9:25 AM 10/17/2014

## 2014-10-17 NOTE — Patient Instructions (Signed)
Health Recommendations for Postmenopausal Women Respected and ongoing research has looked at the most common causes of death, disability, and poor quality of life in postmenopausal women. The causes include heart disease, diseases of blood vessels, diabetes, depression, cancer, and bone loss (osteoporosis). Many things can be done to help lower the chances of developing these and other common problems. CARDIOVASCULAR DISEASE Heart Disease: A heart attack is a medical emergency. Know the signs and symptoms of a heart attack. Below are things women can do to reduce their risk for heart disease.   Do not smoke. If you smoke, quit.  Aim for a healthy weight. Being overweight causes many preventable deaths. Eat a healthy and balanced diet and drink an adequate amount of liquids.  Get moving. Make a commitment to be more physically active. Aim for 30 minutes of activity on most, if not all days of the week.  Eat for heart health. Choose a diet that is low in saturated fat and cholesterol and eliminate trans fat. Include whole grains, vegetables, and fruits. Read and understand the labels on food containers before buying.  Know your numbers. Ask your caregiver to check your blood pressure, cholesterol (total, HDL, LDL, triglycerides) and blood glucose. Work with your caregiver on improving your entire clinical picture.  High blood pressure. Limit or stop your table salt intake (try salt substitute and food seasonings). Avoid salty foods and drinks. Read labels on food containers before buying. Eating well and exercising can help control high blood pressure. STROKE  Stroke is a medical emergency. Stroke may be the result of a blood clot in a blood vessel in the brain or by a brain hemorrhage (bleeding). Know the signs and symptoms of a stroke. To lower the risk of developing a stroke:  Avoid fatty foods.  Quit smoking.  Control your diabetes, blood pressure, and irregular heart rate. THROMBOPHLEBITIS  (BLOOD CLOT) OF THE LEG  Becoming overweight and leading a stationary lifestyle may also contribute to developing blood clots. Controlling your diet and exercising will help lower the risk of developing blood clots. CANCER SCREENING  Breast Cancer: Take steps to reduce your risk of breast cancer.  You should practice "breast self-awareness." This means understanding the normal appearance and feel of your breasts and should include breast self-examination. Any changes detected, no matter how small, should be reported to your caregiver.  After age 4, you should have a clinical breast exam (CBE) every year.  Starting at age 67, you should consider having a mammogram (breast X-ray) every year.  If you have a family history of breast cancer, talk to your caregiver about genetic screening.  If you are at high risk for breast cancer, talk to your caregiver about having an MRI and a mammogram every year.  Intestinal or Stomach Cancer: Tests to consider are a rectal exam, fecal occult blood, sigmoidoscopy, and colonoscopy. Women who are high risk may need to be screened at an earlier age and more often.  Cervical Cancer:  Beginning at age 67, you should have a Pap test every 3 years as long as the past 3 Pap tests have been normal.  If you have had past treatment for cervical cancer or a condition that could lead to cancer, you need Pap tests and screening for cancer for at least 20 years after your treatment.  If you had a hysterectomy for a problem that was not cancer or a condition that could lead to cancer, then you no longer need Pap tests.  If you are between ages 67 and 70, and you have had normal Pap tests going back 10 years, you no longer need Pap tests.  If Pap tests have been discontinued, risk factors (such as a new sexual partner) need to be reassessed to determine if screening should be resumed.  Some medical problems can increase the chance of getting cervical cancer. In these  cases, your caregiver may recommend more frequent screening and Pap tests.  Uterine Cancer: If you have vaginal bleeding after reaching menopause, you should notify your caregiver.  Ovarian Cancer: Other than yearly pelvic exams, there are no reliable tests available to screen for ovarian cancer at this time except for yearly pelvic exams.  Lung Cancer: Yearly chest X-rays can detect lung cancer and should be done on high risk women, such as cigarette smokers and women with chronic lung disease (emphysema).  Skin Cancer: A complete body skin exam should be done at your yearly examination. Avoid overexposure to the sun and ultraviolet light lamps. Use a strong sun block cream when in the sun. All of these things are important for lowering the risk of skin cancer. MENOPAUSE Menopause Symptoms: Hormone therapy products are effective for treating symptoms associated with menopause:  Moderate to severe hot flashes.  Night sweats.  Mood swings.  Headaches.  Tiredness.  Loss of sex drive.  Insomnia.  Other symptoms. Hormone replacement carries certain risks, especially in older women. Women who use or are thinking about using estrogen or estrogen with progestin treatments should discuss that with their caregiver. Your caregiver will help you understand the benefits and risks. The ideal dose of hormone replacement therapy is not known. The Food and Drug Administration (FDA) has concluded that hormone therapy should be used only at the lowest doses and for the shortest amount of time to reach treatment goals.  OSTEOPOROSIS Protecting Against Bone Loss and Preventing Fracture If you use hormone therapy for prevention of bone loss (osteoporosis), the risks for bone loss must outweigh the risk of the therapy. Ask your caregiver about other medications known to be safe and effective for preventing bone loss and fractures. To guard against bone loss or fractures, the following is recommended:  If  you are younger than age 50, take 1000 mg of calcium and at least 600 mg of Vitamin D per day.  If you are older than age 50 but younger than age 70, take 1200 mg of calcium and at least 600 mg of Vitamin D per day.  If you are older than age 70, take 1200 mg of calcium and at least 800 mg of Vitamin D per day. Smoking and excessive alcohol intake increases the risk of osteoporosis. Eat foods rich in calcium and vitamin D and do weight bearing exercises several times a week as your caregiver suggests. DIABETES Diabetes Mellitus: If you have type I or type 2 diabetes, you should keep your blood sugar under control with diet, exercise, and recommended medication. Avoid starchy and fatty foods, and too many sweets. Being overweight can make diabetes control more difficult. COGNITION AND MEMORY Cognition and Memory: Menopausal hormone therapy is not recommended for the prevention of cognitive disorders such as Alzheimer's disease or memory loss.  DEPRESSION  Depression may occur at any age, but it is common in elderly women. This may be because of physical, medical, social (loneliness), or financial problems and needs. If you are experiencing depression because of medical problems and control of symptoms, talk to your caregiver about this. Physical   activity and exercise may help with mood and sleep. Community and volunteer involvement may improve your sense of value and worth. If you have depression and you feel that the problem is getting worse or becoming severe, talk to your caregiver about which treatment options are best for you. ACCIDENTS  Accidents are common and can be serious in elderly woman. Prepare your house to prevent accidents. Eliminate throw rugs, place hand bars in bath, shower, and toilet areas. Avoid wearing high heeled shoes or walking on wet, snowy, and icy areas. Limit or stop driving if you have vision or hearing problems, or if you feel you are unsteady with your movements and  reflexes. HEPATITIS C Hepatitis C is a type of viral infection affecting the liver. It is spread mainly through contact with blood from an infected person. It can be treated, but if left untreated, it can lead to severe liver damage over the years. Many people who are infected do not know that the virus is in their blood. If you are a "baby-boomer", it is recommended that you have one screening test for Hepatitis C. IMMUNIZATIONS  Several immunizations are important to consider having during your senior years, including:   Tetanus, diphtheria, and pertussis booster shot.  Influenza every year before the flu season begins.  Pneumonia vaccine.  Shingles vaccine.  Others, as indicated based on your specific needs. Talk to your caregiver about these. Document Released: 05/27/2005 Document Revised: 08/19/2013 Document Reviewed: 01/21/2008 Exit[employer] Patient Information 2015 [employer]. This information is not intended to replace advice given to you by your health care provider. Make sure you discuss any questions you have with your health care provider. Exercise to Stay Healthy Exercise helps you become and stay healthy. EXERCISE IDEAS AND TIPS Choose exercises that:  You enjoy.  Fit into your day. You do not need to exercise really hard to be healthy. You can do exercises at a slow or medium level and stay healthy. You can:  Stretch before and after working out.  Try yoga, Pilates, or tai chi.  Lift weights.  Walk fast, swim, jog, run, climb stairs, bicycle, dance, or rollerskate.  Take aerobic classes. Exercises that burn about 150 calories:  Running 1  miles in 15 minutes.  Playing volleyball for 45 to 60 minutes.  Washing and waxing a car for 45 to 60 minutes.  Playing touch football for 45 minutes.  Walking 1  miles in 35 minutes.  Pushing a stroller 1  miles in 30 minutes.  Playing basketball for 30 minutes.  Raking leaves for 30 minutes.  Bicycling 5  miles in 30 minutes.  Walking 2 miles in 30 minutes.  Dancing for 30 minutes.  Shoveling snow for 15 minutes.  Swimming laps for 20 minutes.  Walking up stairs for 15 minutes.  Bicycling 4 miles in 15 minutes.  Gardening for 30 to 45 minutes.  Jumping rope for 15 minutes.  Washing windows or floors for 45 to 60 minutes. Document Released: 05/07/2010 Document Revised: 06/27/2011 Document Reviewed: 05/07/2010 Exit[employer] Patient Information 2015 [employer]. This information is not intended to replace advice given to you by your health care provider. Make sure you discuss any questions you have with your health care provider.  

## 2014-10-18 LAB — URINALYSIS W MICROSCOPIC + REFLEX CULTURE
BACTERIA UA: NONE SEEN
Bilirubin Urine: NEGATIVE
CASTS: NONE SEEN
Crystals: NONE SEEN
Glucose, UA: NEGATIVE mg/dL
Hgb urine dipstick: NEGATIVE
Ketones, ur: NEGATIVE mg/dL
Leukocytes, UA: NEGATIVE
Nitrite: NEGATIVE
Protein, ur: NEGATIVE mg/dL
Specific Gravity, Urine: 1.017 (ref 1.005–1.030)
Squamous Epithelial / LPF: NONE SEEN
Urobilinogen, UA: 0.2 mg/dL (ref 0.0–1.0)
pH: 6 (ref 5.0–8.0)

## 2014-10-28 DIAGNOSIS — J0100 Acute maxillary sinusitis, unspecified: Secondary | ICD-10-CM | POA: Diagnosis not present

## 2014-10-28 DIAGNOSIS — R42 Dizziness and giddiness: Secondary | ICD-10-CM | POA: Diagnosis not present

## 2014-10-28 DIAGNOSIS — R030 Elevated blood-pressure reading, without diagnosis of hypertension: Secondary | ICD-10-CM | POA: Diagnosis not present

## 2014-11-11 DIAGNOSIS — G5761 Lesion of plantar nerve, right lower limb: Secondary | ICD-10-CM | POA: Diagnosis not present

## 2014-11-11 DIAGNOSIS — G5762 Lesion of plantar nerve, left lower limb: Secondary | ICD-10-CM | POA: Diagnosis not present

## 2014-11-11 DIAGNOSIS — L03032 Cellulitis of left toe: Secondary | ICD-10-CM | POA: Diagnosis not present

## 2014-11-25 DIAGNOSIS — L03032 Cellulitis of left toe: Secondary | ICD-10-CM | POA: Diagnosis not present

## 2015-02-18 DIAGNOSIS — L2089 Other atopic dermatitis: Secondary | ICD-10-CM | POA: Diagnosis not present

## 2015-02-18 DIAGNOSIS — L738 Other specified follicular disorders: Secondary | ICD-10-CM | POA: Diagnosis not present

## 2015-04-03 DIAGNOSIS — M25572 Pain in left ankle and joints of left foot: Secondary | ICD-10-CM | POA: Diagnosis not present

## 2015-04-03 DIAGNOSIS — M7671 Peroneal tendinitis, right leg: Secondary | ICD-10-CM | POA: Diagnosis not present

## 2015-04-17 DIAGNOSIS — M7671 Peroneal tendinitis, right leg: Secondary | ICD-10-CM | POA: Diagnosis not present

## 2015-04-17 DIAGNOSIS — M25572 Pain in left ankle and joints of left foot: Secondary | ICD-10-CM | POA: Diagnosis not present

## 2015-04-29 DIAGNOSIS — M7672 Peroneal tendinitis, left leg: Secondary | ICD-10-CM | POA: Diagnosis not present

## 2015-05-07 DIAGNOSIS — M25562 Pain in left knee: Secondary | ICD-10-CM | POA: Diagnosis not present

## 2015-05-07 DIAGNOSIS — J309 Allergic rhinitis, unspecified: Secondary | ICD-10-CM | POA: Diagnosis not present

## 2015-05-07 DIAGNOSIS — R030 Elevated blood-pressure reading, without diagnosis of hypertension: Secondary | ICD-10-CM | POA: Diagnosis not present

## 2015-05-14 DIAGNOSIS — M1712 Unilateral primary osteoarthritis, left knee: Secondary | ICD-10-CM | POA: Diagnosis not present

## 2015-05-14 DIAGNOSIS — S83242A Other tear of medial meniscus, current injury, left knee, initial encounter: Secondary | ICD-10-CM | POA: Diagnosis not present

## 2015-05-22 DIAGNOSIS — D2262 Melanocytic nevi of left upper limb, including shoulder: Secondary | ICD-10-CM | POA: Diagnosis not present

## 2015-05-22 DIAGNOSIS — D485 Neoplasm of uncertain behavior of skin: Secondary | ICD-10-CM | POA: Diagnosis not present

## 2015-05-22 DIAGNOSIS — D225 Melanocytic nevi of trunk: Secondary | ICD-10-CM | POA: Diagnosis not present

## 2015-05-22 DIAGNOSIS — L738 Other specified follicular disorders: Secondary | ICD-10-CM | POA: Diagnosis not present

## 2015-05-22 DIAGNOSIS — L821 Other seborrheic keratosis: Secondary | ICD-10-CM | POA: Diagnosis not present

## 2015-05-22 DIAGNOSIS — D2272 Melanocytic nevi of left lower limb, including hip: Secondary | ICD-10-CM | POA: Diagnosis not present

## 2015-05-22 DIAGNOSIS — D1801 Hemangioma of skin and subcutaneous tissue: Secondary | ICD-10-CM | POA: Diagnosis not present

## 2015-05-22 DIAGNOSIS — D2261 Melanocytic nevi of right upper limb, including shoulder: Secondary | ICD-10-CM | POA: Diagnosis not present

## 2015-06-18 DIAGNOSIS — M7672 Peroneal tendinitis, left leg: Secondary | ICD-10-CM | POA: Diagnosis not present

## 2015-06-25 DIAGNOSIS — S83242D Other tear of medial meniscus, current injury, left knee, subsequent encounter: Secondary | ICD-10-CM | POA: Diagnosis not present

## 2015-07-16 DIAGNOSIS — L718 Other rosacea: Secondary | ICD-10-CM | POA: Diagnosis not present

## 2015-07-16 DIAGNOSIS — L300 Nummular dermatitis: Secondary | ICD-10-CM | POA: Diagnosis not present

## 2015-07-17 DIAGNOSIS — M79605 Pain in left leg: Secondary | ICD-10-CM | POA: Diagnosis not present

## 2015-07-17 DIAGNOSIS — H6123 Impacted cerumen, bilateral: Secondary | ICD-10-CM | POA: Diagnosis not present

## 2015-08-10 DIAGNOSIS — R030 Elevated blood-pressure reading, without diagnosis of hypertension: Secondary | ICD-10-CM | POA: Diagnosis not present

## 2015-08-10 DIAGNOSIS — J3489 Other specified disorders of nose and nasal sinuses: Secondary | ICD-10-CM | POA: Diagnosis not present

## 2015-08-10 DIAGNOSIS — L309 Dermatitis, unspecified: Secondary | ICD-10-CM | POA: Diagnosis not present

## 2015-08-14 DIAGNOSIS — L03031 Cellulitis of right toe: Secondary | ICD-10-CM | POA: Diagnosis not present

## 2015-08-14 DIAGNOSIS — L602 Onychogryphosis: Secondary | ICD-10-CM | POA: Diagnosis not present

## 2015-08-14 DIAGNOSIS — M7671 Peroneal tendinitis, right leg: Secondary | ICD-10-CM | POA: Diagnosis not present

## 2015-08-14 DIAGNOSIS — M79674 Pain in right toe(s): Secondary | ICD-10-CM | POA: Diagnosis not present

## 2015-08-17 DIAGNOSIS — M810 Age-related osteoporosis without current pathological fracture: Secondary | ICD-10-CM

## 2015-08-17 HISTORY — DX: Age-related osteoporosis without current pathological fracture: M81.0

## 2015-08-28 DIAGNOSIS — L03031 Cellulitis of right toe: Secondary | ICD-10-CM | POA: Diagnosis not present

## 2015-09-03 DIAGNOSIS — Z1231 Encounter for screening mammogram for malignant neoplasm of breast: Secondary | ICD-10-CM | POA: Diagnosis not present

## 2015-09-03 DIAGNOSIS — Z803 Family history of malignant neoplasm of breast: Secondary | ICD-10-CM | POA: Diagnosis not present

## 2015-09-03 DIAGNOSIS — M810 Age-related osteoporosis without current pathological fracture: Secondary | ICD-10-CM | POA: Diagnosis not present

## 2015-09-04 ENCOUNTER — Encounter: Payer: Self-pay | Admitting: Gynecology

## 2015-09-15 ENCOUNTER — Telehealth: Payer: Self-pay | Admitting: Gynecology

## 2015-09-15 NOTE — Telephone Encounter (Signed)
Left message for pt to call.

## 2015-09-15 NOTE — Telephone Encounter (Signed)
Tell patient that her recent bone density did show osteoporosis. I just wanted to make sure that she is following up with her primary physician who ordered this in reference to this.

## 2015-09-15 NOTE — Telephone Encounter (Signed)
Pt informed with the below note. 

## 2015-09-17 DIAGNOSIS — Z0100 Encounter for examination of eyes and vision without abnormal findings: Secondary | ICD-10-CM | POA: Diagnosis not present

## 2015-09-17 DIAGNOSIS — H524 Presbyopia: Secondary | ICD-10-CM | POA: Diagnosis not present

## 2015-09-17 DIAGNOSIS — H5210 Myopia, unspecified eye: Secondary | ICD-10-CM | POA: Diagnosis not present

## 2015-10-01 DIAGNOSIS — S83242D Other tear of medial meniscus, current injury, left knee, subsequent encounter: Secondary | ICD-10-CM | POA: Diagnosis not present

## 2015-10-21 ENCOUNTER — Ambulatory Visit (INDEPENDENT_AMBULATORY_CARE_PROVIDER_SITE_OTHER): Payer: Commercial Managed Care - HMO | Admitting: Women's Health

## 2015-10-21 ENCOUNTER — Encounter: Payer: Self-pay | Admitting: Women's Health

## 2015-10-21 VITALS — BP 129/83 | Ht 67.0 in | Wt 201.8 lb

## 2015-10-21 DIAGNOSIS — M810 Age-related osteoporosis without current pathological fracture: Secondary | ICD-10-CM

## 2015-10-21 DIAGNOSIS — N898 Other specified noninflammatory disorders of vagina: Secondary | ICD-10-CM

## 2015-10-21 LAB — WET PREP FOR TRICH, YEAST, CLUE
Clue Cells Wet Prep HPF POC: NONE SEEN
Trich, Wet Prep: NONE SEEN
YEAST WET PREP: NONE SEEN

## 2015-10-21 NOTE — Progress Notes (Signed)
ADALIND GULAS Jan 27, 1946 [place]:[id number]    History:    Presents for breast and pelvic exam. Normal Pap and mammogram history. 08/2015 DEXA T score -2.6 at [facility] femoral neck -2, bone densities stable had been on Fosamax 5 years in the past but did not like it, primary care manages and has an appointment in 2 weeks to discuss. Currently exercising on a regular basis. Negative colonoscopy 2013. Current on vaccines Zostavax 2014, Prevnar 13  2015. HSV 1 rare outbreaks.  Past medical history, past surgical history, family history and social history were all reviewed and documented in the EPIC chart. Retired [employer]. 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 One sister with breast cancer, 1 sister Alzheimer's deceased, brother deceased with DVT, 1 brother with diabetes. Numerous family members with hypertension and diabetes. Daughter and 3 grandchildren all doing well.  ROS:  A ROS was performed and pertinent positives and negatives are included.  Exam:  Filed Vitals:   10/21/15 0831  BP: 129/83    General appearance:  Normal Thyroid:  Symmetrical, normal in size, without palpable masses or nodularity. Respiratory  Auscultation:  Clear without wheezing or rhonchi Cardiovascular  Auscultation:  Regular rate, without rubs, murmurs or gallops  Edema/varicosities:  Not grossly evident Abdominal  Soft,nontender, without masses, guarding or rebound.  Liver/spleen:  No organomegaly noted  Hernia:  None appreciated  Skin  Inspection:  Grossly normal   Breasts: Examined lying and sitting.     Right: Without masses, retractions, discharge or axillary adenopathy.     Left: Without masses, retractions, discharge or axillary adenopathy. Gentitourinary   Inguinal/mons:  Normal without inguinal adenopathy  External genitalia:  Normal  BUS/Urethra/Skene's glands:  Normal  Vagina:  NormalScant white discharge wet prep negative  Cervix:  Normal  Uterus:   normal in size, shape and contour.  Midline and  mobile  Adnexa/parametria:     Rt: Without masses or tenderness.   Lt: Without masses or tenderness.  Anus and perineum: Normal  Digital rectal exam: Normal sphincter tone without palpated masses or tenderness  Assessment/Plan:  70 y.o. WBF G1 P1 for breast and pelvic exam with complaint of questionable discharge with odor.  Postmenopausal/no HRT/no bleeding/not sexually active Osteoporosis-Fosamax 2005-2010-primary care manages Obesity Primary care manages labs and meds  Plan: DEXA discussed, T score has decreased densities stable Fosamax in the past for 5 years, reviewed importance of continuing to increase regular exercise, home safety and fall prevention discussed. Briefly discussed Prolia will discuss with primary care at scheduled office appointment. SBE's, continue annual 3-D screening mammogram. Reviewed importance of decreasing carbohydrates and calories in diet for weight loss. Encouraged continue active lifestyle. Reviewed wet prep was negative and exam normal no treatment needed. UA, no Pap, new guidelines reviewed.      Huel Cote [facility], 8:51 AM 10/21/2015

## 2015-10-21 NOTE — Patient Instructions (Signed)
Menopause is a normal process in which your reproductive ability comes to an end. This process happens gradually over a span of months to years, usually between the ages of 48 and 55. Menopause is complete when you have missed 12 consecutive menstrual periods. It is important to talk with your health care provider about some of the most common conditions that affect postmenopausal women, such as heart disease, cancer, and bone loss (osteoporosis). Adopting a healthy lifestyle and getting preventive care can help to promote your health and wellness. Those actions can also lower your chances of developing some of these common conditions. WHAT SHOULD I KNOW ABOUT MENOPAUSE? During menopause, you may experience a number of symptoms, such as:  Moderate-to-severe hot flashes.  Night sweats.  Decrease in sex drive.  Mood swings.  Headaches.  Tiredness.  Irritability.  Memory problems.  Insomnia. Choosing to treat or not to treat menopausal changes is an individual decision that you make with your health care provider. WHAT SHOULD I KNOW ABOUT HORMONE REPLACEMENT THERAPY AND SUPPLEMENTS? Hormone therapy products are effective for treating symptoms that are associated with menopause, such as hot flashes and night sweats. Hormone replacement carries certain risks, especially as you become older. If you are thinking about using estrogen or estrogen with progestin treatments, discuss the benefits and risks with your health care provider. WHAT SHOULD I KNOW ABOUT HEART DISEASE AND STROKE? Heart disease, heart attack, and stroke become more likely as you age. This may be due, in part, to the hormonal changes that your body experiences during menopause. These can affect how your body processes dietary fats, triglycerides, and cholesterol. Heart attack and stroke are both medical emergencies. There are many things that you can do to help prevent heart disease and stroke:  Have your blood pressure  checked at least every 1-2 years. High blood pressure causes heart disease and increases the risk of stroke.  If you are 55-79 years old, ask your health care provider if you should take aspirin to prevent a heart attack or a stroke.  Do not use any tobacco products, including cigarettes, chewing tobacco, or electronic cigarettes. If you need help quitting, ask your health care provider.  It is important to eat a healthy diet and maintain a healthy weight.  Be sure to include plenty of vegetables, fruits, low-fat dairy products, and lean protein.  Avoid eating foods that are high in solid fats, added sugars, or salt (sodium).  Get regular exercise. This is one of the most important things that you can do for your health.  Try to exercise for at least 150 minutes each week. The type of exercise that you do should increase your heart rate and make you sweat. This is known as moderate-intensity exercise.  Try to do strengthening exercises at least twice each week. Do these in addition to the moderate-intensity exercise.  Know your numbers.Ask your health care provider to check your cholesterol and your blood glucose. Continue to have your blood tested as directed by your health care provider. WHAT SHOULD I KNOW ABOUT CANCER SCREENING? There are several types of cancer. Take the following steps to reduce your risk and to catch any cancer development as early as possible. Breast Cancer  Practice breast self-awareness.  This means understanding how your breasts normally appear and feel.  It also means doing regular breast self-exams. Let your health care provider know about any changes, no matter how small.  If you are 40 or older, have a clinician do a   breast exam (clinical breast exam or CBE) every year. Depending on your age, family history, and medical history, it may be recommended that you also have a yearly breast X-ray (mammogram).  If you have a family history of breast cancer,  talk with your health care provider about genetic screening.  If you are at high risk for breast cancer, talk with your health care provider about having an MRI and a mammogram every year.  Breast cancer (BRCA) gene test is recommended for women who have family members with BRCA-related cancers. Results of the assessment will determine the need for genetic counseling and BRCA1 and for BRCA2 testing. BRCA-related cancers include these types:  Breast. This occurs in males or females.  Ovarian.  Tubal. This may also be called fallopian tube cancer.  Cancer of the abdominal or pelvic lining (peritoneal cancer).  Prostate.  Pancreatic. Cervical, Uterine, and Ovarian Cancer Your health care provider may recommend that you be screened regularly for cancer of the pelvic organs. These include your ovaries, uterus, and vagina. This screening involves a pelvic exam, which includes checking for microscopic changes to the surface of your cervix (Pap test).  For women ages 21-65, health care providers may recommend a pelvic exam and a Pap test every three years. For women ages 77-65, they may recommend the Pap test and pelvic exam, combined with testing for human papilloma virus (HPV), every five years. Some types of HPV increase your risk of cervical cancer. Testing for HPV may also be done on women of any age who have unclear Pap test results.  Other health care providers may not recommend any screening for nonpregnant women who are considered low risk for pelvic cancer and have no symptoms. Ask your health care provider if a screening pelvic exam is right for you.  If you have had past treatment for cervical cancer or a condition that could lead to cancer, you need Pap tests and screening for cancer for at least 20 years after your treatment. If Pap tests have been discontinued for you, your risk factors (such as having a new sexual partner) need to be reassessed to determine if you should start having  screenings again. Some women have medical problems that increase the chance of getting cervical cancer. In these cases, your health care provider may recommend that you have screening and Pap tests more often.  If you have a family history of uterine cancer or ovarian cancer, talk with your health care provider about genetic screening.  If you have vaginal bleeding after reaching menopause, tell your health care provider.  There are currently no reliable tests available to screen for ovarian cancer. Lung Cancer Lung cancer screening is recommended for adults 3-70 years old who are at high risk for lung cancer because of a history of smoking. A yearly low-dose CT scan of the lungs is recommended if you:  Currently smoke.  Have a history of at least 30 pack-years of smoking and you currently smoke or have quit within the past 15 years. A pack-year is smoking an average of one pack of cigarettes per day for one year. Yearly screening should:  Continue until it has been 15 years since you quit.  Stop if you develop a health problem that would prevent you from having lung cancer treatment. Colorectal Cancer  This type of cancer can be detected and can often be prevented.  Routine colorectal cancer screening usually begins at age 38 and continues through age 12.  If you have  risk factors for colon cancer, your health care provider may recommend that you be screened at an earlier age.  If you have a family history of colorectal cancer, talk with your health care provider about genetic screening.  Your health care provider may also recommend using home test kits to check for hidden blood in your stool.  A small camera at the end of a tube can be used to examine your colon directly (sigmoidoscopy or colonoscopy). This is done to check for the earliest forms of colorectal cancer.  Direct examination of the colon should be repeated every 5-10 years until age 67. However, if early forms of  precancerous polyps or small growths are found or if you have a family history or genetic risk for colorectal cancer, you may need to be screened more often. Skin Cancer  Check your skin from head to toe regularly.  Monitor any moles. Be sure to tell your health care provider:  About any new moles or changes in moles, especially if there is a change in a mole's shape or color.  If you have a mole that is larger than the size of a pencil eraser.  If any of your family members has a history of skin cancer, especially at a Kelly Quinn age, talk with your health care provider about genetic screening.  Always use sunscreen. Apply sunscreen liberally and repeatedly throughout the day.  Whenever you are outside, protect yourself by wearing long sleeves, pants, a wide-brimmed hat, and sunglasses. WHAT SHOULD I KNOW ABOUT OSTEOPOROSIS? Osteoporosis is a condition in which bone destruction happens more quickly than new bone creation. After menopause, you may be at an increased risk for osteoporosis. To help prevent osteoporosis or the bone fractures that can happen because of osteoporosis, the following is recommended:  If you are 39-61 years old, get at least 1,000 mg of calcium and at least 600 mg of vitamin D per day.  If you are older than age 16 but younger than age 7, get at least 1,200 mg of calcium and at least 600 mg of vitamin D per day.  If you are older than age 47, get at least 1,200 mg of calcium and at least 800 mg of vitamin D per day. Smoking and excessive alcohol intake increase the risk of osteoporosis. Eat foods that are rich in calcium and vitamin D, and do weight-bearing exercises several times each week as directed by your health care provider. WHAT SHOULD I KNOW ABOUT HOW MENOPAUSE AFFECTS Kelly Quinn? Depression may occur at any age, but it is more common as you become older. Common symptoms of depression include:  Low or sad mood.  Changes in sleep patterns.  Changes  in appetite or eating patterns.  Feeling an overall lack of motivation or enjoyment of activities that you previously enjoyed.  Frequent crying spells. Talk with your health care provider if you think that you are experiencing depression. WHAT SHOULD I KNOW ABOUT IMMUNIZATIONS? It is important that you get and maintain your immunizations. These include:  Tetanus, diphtheria, and pertussis (Tdap) booster vaccine.  Influenza every year before the flu season begins.  Pneumonia vaccine.  Shingles vaccine. Your health care provider may also recommend other immunizations.   This information is not intended to replace advice given to you by your health care provider. Make sure you discuss any questions you have with your health care provider.   Document Released: 05/27/2005 Document Revised: 04/25/2014 Document Reviewed: 12/05/2013 Elsevier Interactive Patient Education 2016 [employer] Denosumab injection What is this medicine? DENOSUMAB (den oh sue mab) slows bone breakdown. Kelly Quinn is used to treat osteoporosis in women after menopause and in men. Kelly Quinn is used to prevent bone fractures and other bone problems caused by cancer bone metastases. Kelly Quinn is also used to treat giant cell tumor of the bone. This medicine may be used for other purposes; ask your health care provider or pharmacist if you have questions. What should I tell my health care provider before I take this medicine? They need to know if you have any of these conditions: -dental disease -eczema -infection or history of infections -kidney disease or on dialysis -low blood calcium or vitamin D -malabsorption syndrome -scheduled to have surgery or tooth extraction -taking medicine that contains denosumab -thyroid or parathyroid disease -an unusual reaction to denosumab, other medicines, foods, dyes, or preservatives -pregnant or trying to get pregnant -breast-feeding How should I use this medicine? This medicine is  for injection under the skin. It is given by a health care professional in a hospital or clinic setting. If you are getting Kelly Quinn, a special MedGuide will be given to you by the pharmacist with each prescription and refill. Be sure to read this information carefully each time. For Kelly Quinn, talk to your pediatrician regarding the use of this medicine in children. Special care may be needed. For Kelly Quinn, talk to your pediatrician regarding the use of this medicine in children. While this drug may be prescribed for children as Kelly Quinn as 13 years for selected conditions, precautions do apply. Overdosage: If you think you have taken too much of this medicine contact a poison control center or emergency room at once. NOTE: This medicine is only for you. Do not share this medicine with others. What if I miss a dose? It is important not to miss your dose. Call your doctor or health care professional if you are unable to keep an appointment. What may interact with this medicine? Do not take this medicine with any of the following medications: -other medicines containing denosumab This medicine may also interact with the following medications: -medicines that suppress the immune system -medicines that treat cancer -steroid medicines like prednisone or cortisone This list may not describe all possible interactions. Give your health care provider a list of all the medicines, herbs, non-prescription drugs, or dietary supplements you use. Also tell them if you smoke, drink alcohol, or use illegal drugs. Some items may interact with your medicine. What should I watch for while using this medicine? Visit your doctor or health care professional for regular checks on your progress. Your doctor or health care professional may order blood tests and other tests to Quinn how you are doing. Call your doctor or health care professional if you get a cold or other infection while receiving this medicine. Do not treat yourself.  This medicine may decrease your body's ability to fight infection. You should make sure you get enough calcium and vitamin D while you are taking this medicine, unless your doctor tells you not to. Discuss the foods you eat and the vitamins you take with your health care professional. Quinn your dentist regularly. Brush and floss your teeth as directed. Before you have any dental work done, tell your dentist you are receiving this medicine. Do not become pregnant while taking this medicine or for 5 months after stopping it. Women should inform their doctor if they wish to become pregnant or think they might be pregnant. There is a potential for serious  side effects to an unborn child. Talk to your health care professional or pharmacist for more information. What side effects may I notice from receiving this medicine? Side effects that you should report to your doctor or health care professional as soon as possible: -allergic reactions like skin rash, itching or hives, swelling of the face, lips, or tongue -breathing problems -chest pain -fast, irregular heartbeat -feeling faint or lightheaded, falls -fever, chills, or any other sign of infection -muscle spasms, tightening, or twitches -numbness or tingling -skin blisters or bumps, or is dry, peels, or red -slow healing or unexplained pain in the mouth or jaw -unusual bleeding or bruising Side effects that usually do not require medical attention (Report these to your doctor or health care professional if they continue or are bothersome.): -muscle pain -stomach upset, gas This list may not describe all possible side effects. Call your doctor for medical advice about side effects. You may report side effects to FDA at 1-800-FDA-1088. Where should I keep my medicine? This medicine is only given in a clinic, doctor's office, or other health care setting and will not be stored at home. NOTE: This sheet is a summary. It may not cover all possible  information. If you have questions about this medicine, talk to your doctor, pharmacist, or health care provider.    2016, Elsevier/[employer] Standard. (2011-10-03 12:37:47)

## 2015-11-05 DIAGNOSIS — R600 Localized edema: Secondary | ICD-10-CM | POA: Diagnosis not present

## 2015-11-05 DIAGNOSIS — R42 Dizziness and giddiness: Secondary | ICD-10-CM | POA: Diagnosis not present

## 2015-11-05 DIAGNOSIS — R030 Elevated blood-pressure reading, without diagnosis of hypertension: Secondary | ICD-10-CM | POA: Diagnosis not present

## 2015-11-05 DIAGNOSIS — S83242D Other tear of medial meniscus, current injury, left knee, subsequent encounter: Secondary | ICD-10-CM | POA: Diagnosis not present

## 2015-11-05 DIAGNOSIS — E7800 Pure hypercholesterolemia, unspecified: Secondary | ICD-10-CM | POA: Diagnosis not present

## 2015-11-12 DIAGNOSIS — M25562 Pain in left knee: Secondary | ICD-10-CM | POA: Diagnosis not present

## 2015-11-16 DIAGNOSIS — S83242D Other tear of medial meniscus, current injury, left knee, subsequent encounter: Secondary | ICD-10-CM | POA: Diagnosis not present

## 2015-11-17 HISTORY — PX: DIAGNOSTIC LAPAROSCOPY: SUR761

## 2015-12-02 DIAGNOSIS — S83241A Other tear of medial meniscus, current injury, right knee, initial encounter: Secondary | ICD-10-CM | POA: Diagnosis not present

## 2015-12-02 DIAGNOSIS — M94262 Chondromalacia, left knee: Secondary | ICD-10-CM | POA: Diagnosis not present

## 2015-12-02 DIAGNOSIS — S83281A Other tear of lateral meniscus, current injury, right knee, initial encounter: Secondary | ICD-10-CM | POA: Diagnosis not present

## 2015-12-02 DIAGNOSIS — M23332 Other meniscus derangements, other medial meniscus, left knee: Secondary | ICD-10-CM | POA: Diagnosis not present

## 2015-12-02 DIAGNOSIS — G8918 Other acute postprocedural pain: Secondary | ICD-10-CM | POA: Diagnosis not present

## 2015-12-02 DIAGNOSIS — M23352 Other meniscus derangements, posterior horn of lateral meniscus, left knee: Secondary | ICD-10-CM | POA: Diagnosis not present

## 2015-12-10 DIAGNOSIS — S83281D Other tear of lateral meniscus, current injury, right knee, subsequent encounter: Secondary | ICD-10-CM | POA: Diagnosis not present

## 2015-12-10 DIAGNOSIS — S83241D Other tear of medial meniscus, current injury, right knee, subsequent encounter: Secondary | ICD-10-CM | POA: Diagnosis not present

## 2015-12-14 DIAGNOSIS — M25662 Stiffness of left knee, not elsewhere classified: Secondary | ICD-10-CM | POA: Diagnosis not present

## 2015-12-14 DIAGNOSIS — M25562 Pain in left knee: Secondary | ICD-10-CM | POA: Diagnosis not present

## 2015-12-14 DIAGNOSIS — R262 Difficulty in walking, not elsewhere classified: Secondary | ICD-10-CM | POA: Diagnosis not present

## 2015-12-14 DIAGNOSIS — S83242D Other tear of medial meniscus, current injury, left knee, subsequent encounter: Secondary | ICD-10-CM | POA: Diagnosis not present

## 2015-12-16 DIAGNOSIS — M25562 Pain in left knee: Secondary | ICD-10-CM | POA: Diagnosis not present

## 2015-12-17 DIAGNOSIS — S83242D Other tear of medial meniscus, current injury, left knee, subsequent encounter: Secondary | ICD-10-CM | POA: Diagnosis not present

## 2015-12-17 DIAGNOSIS — R262 Difficulty in walking, not elsewhere classified: Secondary | ICD-10-CM | POA: Diagnosis not present

## 2015-12-17 DIAGNOSIS — M25662 Stiffness of left knee, not elsewhere classified: Secondary | ICD-10-CM | POA: Diagnosis not present

## 2015-12-17 DIAGNOSIS — M25562 Pain in left knee: Secondary | ICD-10-CM | POA: Diagnosis not present

## 2015-12-22 DIAGNOSIS — S83242D Other tear of medial meniscus, current injury, left knee, subsequent encounter: Secondary | ICD-10-CM | POA: Diagnosis not present

## 2015-12-22 DIAGNOSIS — M25562 Pain in left knee: Secondary | ICD-10-CM | POA: Diagnosis not present

## 2015-12-22 DIAGNOSIS — R262 Difficulty in walking, not elsewhere classified: Secondary | ICD-10-CM | POA: Diagnosis not present

## 2015-12-22 DIAGNOSIS — M25662 Stiffness of left knee, not elsewhere classified: Secondary | ICD-10-CM | POA: Diagnosis not present

## 2015-12-24 DIAGNOSIS — R262 Difficulty in walking, not elsewhere classified: Secondary | ICD-10-CM | POA: Diagnosis not present

## 2015-12-24 DIAGNOSIS — S83242D Other tear of medial meniscus, current injury, left knee, subsequent encounter: Secondary | ICD-10-CM | POA: Diagnosis not present

## 2015-12-24 DIAGNOSIS — M25562 Pain in left knee: Secondary | ICD-10-CM | POA: Diagnosis not present

## 2015-12-24 DIAGNOSIS — M25662 Stiffness of left knee, not elsewhere classified: Secondary | ICD-10-CM | POA: Diagnosis not present

## 2015-12-29 DIAGNOSIS — R262 Difficulty in walking, not elsewhere classified: Secondary | ICD-10-CM | POA: Diagnosis not present

## 2015-12-29 DIAGNOSIS — S83242D Other tear of medial meniscus, current injury, left knee, subsequent encounter: Secondary | ICD-10-CM | POA: Diagnosis not present

## 2015-12-29 DIAGNOSIS — M25662 Stiffness of left knee, not elsewhere classified: Secondary | ICD-10-CM | POA: Diagnosis not present

## 2015-12-29 DIAGNOSIS — M25562 Pain in left knee: Secondary | ICD-10-CM | POA: Diagnosis not present

## 2015-12-31 DIAGNOSIS — R262 Difficulty in walking, not elsewhere classified: Secondary | ICD-10-CM | POA: Diagnosis not present

## 2015-12-31 DIAGNOSIS — M25662 Stiffness of left knee, not elsewhere classified: Secondary | ICD-10-CM | POA: Diagnosis not present

## 2015-12-31 DIAGNOSIS — M25562 Pain in left knee: Secondary | ICD-10-CM | POA: Diagnosis not present

## 2015-12-31 DIAGNOSIS — S83242D Other tear of medial meniscus, current injury, left knee, subsequent encounter: Secondary | ICD-10-CM | POA: Diagnosis not present

## 2016-01-05 DIAGNOSIS — M25662 Stiffness of left knee, not elsewhere classified: Secondary | ICD-10-CM | POA: Diagnosis not present

## 2016-01-05 DIAGNOSIS — R262 Difficulty in walking, not elsewhere classified: Secondary | ICD-10-CM | POA: Diagnosis not present

## 2016-01-05 DIAGNOSIS — S83242D Other tear of medial meniscus, current injury, left knee, subsequent encounter: Secondary | ICD-10-CM | POA: Diagnosis not present

## 2016-01-05 DIAGNOSIS — M25562 Pain in left knee: Secondary | ICD-10-CM | POA: Diagnosis not present

## 2016-01-06 ENCOUNTER — Other Ambulatory Visit: Payer: Self-pay

## 2016-01-06 DIAGNOSIS — M25562 Pain in left knee: Secondary | ICD-10-CM | POA: Diagnosis not present

## 2016-01-06 MED ORDER — NONFORMULARY OR COMPOUNDED ITEM: 1 refills | Status: DC

## 2016-01-06 NOTE — Telephone Encounter (Signed)
Ok for refill? 

## 2016-01-06 NOTE — Telephone Encounter (Signed)
Called into pharmacy

## 2016-01-07 DIAGNOSIS — R262 Difficulty in walking, not elsewhere classified: Secondary | ICD-10-CM | POA: Diagnosis not present

## 2016-01-07 DIAGNOSIS — M25662 Stiffness of left knee, not elsewhere classified: Secondary | ICD-10-CM | POA: Diagnosis not present

## 2016-01-07 DIAGNOSIS — M25562 Pain in left knee: Secondary | ICD-10-CM | POA: Diagnosis not present

## 2016-01-07 DIAGNOSIS — S83242D Other tear of medial meniscus, current injury, left knee, subsequent encounter: Secondary | ICD-10-CM | POA: Diagnosis not present

## 2016-01-12 DIAGNOSIS — R262 Difficulty in walking, not elsewhere classified: Secondary | ICD-10-CM | POA: Diagnosis not present

## 2016-01-12 DIAGNOSIS — S83242D Other tear of medial meniscus, current injury, left knee, subsequent encounter: Secondary | ICD-10-CM | POA: Diagnosis not present

## 2016-01-12 DIAGNOSIS — M25662 Stiffness of left knee, not elsewhere classified: Secondary | ICD-10-CM | POA: Diagnosis not present

## 2016-01-12 DIAGNOSIS — M25562 Pain in left knee: Secondary | ICD-10-CM | POA: Diagnosis not present

## 2016-01-14 DIAGNOSIS — R262 Difficulty in walking, not elsewhere classified: Secondary | ICD-10-CM | POA: Diagnosis not present

## 2016-01-14 DIAGNOSIS — M25662 Stiffness of left knee, not elsewhere classified: Secondary | ICD-10-CM | POA: Diagnosis not present

## 2016-01-14 DIAGNOSIS — S83242D Other tear of medial meniscus, current injury, left knee, subsequent encounter: Secondary | ICD-10-CM | POA: Diagnosis not present

## 2016-01-14 DIAGNOSIS — M25562 Pain in left knee: Secondary | ICD-10-CM | POA: Diagnosis not present

## 2016-01-21 DIAGNOSIS — M25662 Stiffness of left knee, not elsewhere classified: Secondary | ICD-10-CM | POA: Diagnosis not present

## 2016-01-21 DIAGNOSIS — R262 Difficulty in walking, not elsewhere classified: Secondary | ICD-10-CM | POA: Diagnosis not present

## 2016-01-21 DIAGNOSIS — S83249D Other tear of medial meniscus, current injury, unspecified knee, subsequent encounter: Secondary | ICD-10-CM | POA: Diagnosis not present

## 2016-01-21 DIAGNOSIS — M25562 Pain in left knee: Secondary | ICD-10-CM | POA: Diagnosis not present

## 2016-01-26 DIAGNOSIS — M25562 Pain in left knee: Secondary | ICD-10-CM | POA: Diagnosis not present

## 2016-01-26 DIAGNOSIS — R262 Difficulty in walking, not elsewhere classified: Secondary | ICD-10-CM | POA: Diagnosis not present

## 2016-01-26 DIAGNOSIS — M25662 Stiffness of left knee, not elsewhere classified: Secondary | ICD-10-CM | POA: Diagnosis not present

## 2016-01-26 DIAGNOSIS — S83242D Other tear of medial meniscus, current injury, left knee, subsequent encounter: Secondary | ICD-10-CM | POA: Diagnosis not present

## 2016-01-28 DIAGNOSIS — R262 Difficulty in walking, not elsewhere classified: Secondary | ICD-10-CM | POA: Diagnosis not present

## 2016-01-28 DIAGNOSIS — M25562 Pain in left knee: Secondary | ICD-10-CM | POA: Diagnosis not present

## 2016-01-28 DIAGNOSIS — M25662 Stiffness of left knee, not elsewhere classified: Secondary | ICD-10-CM | POA: Diagnosis not present

## 2016-01-28 DIAGNOSIS — S83242D Other tear of medial meniscus, current injury, left knee, subsequent encounter: Secondary | ICD-10-CM | POA: Diagnosis not present

## 2016-02-01 DIAGNOSIS — G5762 Lesion of plantar nerve, left lower limb: Secondary | ICD-10-CM | POA: Diagnosis not present

## 2016-02-01 DIAGNOSIS — M21961 Unspecified acquired deformity of right lower leg: Secondary | ICD-10-CM | POA: Diagnosis not present

## 2016-02-01 DIAGNOSIS — M21962 Unspecified acquired deformity of left lower leg: Secondary | ICD-10-CM | POA: Diagnosis not present

## 2016-02-02 DIAGNOSIS — M25662 Stiffness of left knee, not elsewhere classified: Secondary | ICD-10-CM | POA: Diagnosis not present

## 2016-02-02 DIAGNOSIS — S83242D Other tear of medial meniscus, current injury, left knee, subsequent encounter: Secondary | ICD-10-CM | POA: Diagnosis not present

## 2016-02-02 DIAGNOSIS — M25562 Pain in left knee: Secondary | ICD-10-CM | POA: Diagnosis not present

## 2016-02-02 DIAGNOSIS — R262 Difficulty in walking, not elsewhere classified: Secondary | ICD-10-CM | POA: Diagnosis not present

## 2016-02-04 DIAGNOSIS — M25562 Pain in left knee: Secondary | ICD-10-CM | POA: Diagnosis not present

## 2016-02-05 DIAGNOSIS — R262 Difficulty in walking, not elsewhere classified: Secondary | ICD-10-CM | POA: Diagnosis not present

## 2016-02-05 DIAGNOSIS — S83242D Other tear of medial meniscus, current injury, left knee, subsequent encounter: Secondary | ICD-10-CM | POA: Diagnosis not present

## 2016-02-05 DIAGNOSIS — M25562 Pain in left knee: Secondary | ICD-10-CM | POA: Diagnosis not present

## 2016-02-05 DIAGNOSIS — M25662 Stiffness of left knee, not elsewhere classified: Secondary | ICD-10-CM | POA: Diagnosis not present

## 2016-02-09 DIAGNOSIS — M25562 Pain in left knee: Secondary | ICD-10-CM | POA: Diagnosis not present

## 2016-02-09 DIAGNOSIS — M1712 Unilateral primary osteoarthritis, left knee: Secondary | ICD-10-CM | POA: Diagnosis not present

## 2016-02-09 DIAGNOSIS — R262 Difficulty in walking, not elsewhere classified: Secondary | ICD-10-CM | POA: Diagnosis not present

## 2016-02-09 DIAGNOSIS — M25662 Stiffness of left knee, not elsewhere classified: Secondary | ICD-10-CM | POA: Diagnosis not present

## 2016-02-11 DIAGNOSIS — M25562 Pain in left knee: Secondary | ICD-10-CM | POA: Diagnosis not present

## 2016-02-11 DIAGNOSIS — M25662 Stiffness of left knee, not elsewhere classified: Secondary | ICD-10-CM | POA: Diagnosis not present

## 2016-02-11 DIAGNOSIS — R262 Difficulty in walking, not elsewhere classified: Secondary | ICD-10-CM | POA: Diagnosis not present

## 2016-02-11 DIAGNOSIS — S83242D Other tear of medial meniscus, current injury, left knee, subsequent encounter: Secondary | ICD-10-CM | POA: Diagnosis not present

## 2016-02-15 DIAGNOSIS — M25662 Stiffness of left knee, not elsewhere classified: Secondary | ICD-10-CM | POA: Diagnosis not present

## 2016-02-15 DIAGNOSIS — S83242D Other tear of medial meniscus, current injury, left knee, subsequent encounter: Secondary | ICD-10-CM | POA: Diagnosis not present

## 2016-02-15 DIAGNOSIS — R262 Difficulty in walking, not elsewhere classified: Secondary | ICD-10-CM | POA: Diagnosis not present

## 2016-02-15 DIAGNOSIS — M25562 Pain in left knee: Secondary | ICD-10-CM | POA: Diagnosis not present

## 2016-02-17 DIAGNOSIS — S83242D Other tear of medial meniscus, current injury, left knee, subsequent encounter: Secondary | ICD-10-CM | POA: Diagnosis not present

## 2016-02-17 DIAGNOSIS — R262 Difficulty in walking, not elsewhere classified: Secondary | ICD-10-CM | POA: Diagnosis not present

## 2016-02-17 DIAGNOSIS — M25662 Stiffness of left knee, not elsewhere classified: Secondary | ICD-10-CM | POA: Diagnosis not present

## 2016-02-17 DIAGNOSIS — M25562 Pain in left knee: Secondary | ICD-10-CM | POA: Diagnosis not present

## 2016-03-16 DIAGNOSIS — G5762 Lesion of plantar nerve, left lower limb: Secondary | ICD-10-CM | POA: Diagnosis not present

## 2016-03-16 DIAGNOSIS — M21961 Unspecified acquired deformity of right lower leg: Secondary | ICD-10-CM | POA: Diagnosis not present

## 2016-03-16 DIAGNOSIS — M21962 Unspecified acquired deformity of left lower leg: Secondary | ICD-10-CM | POA: Diagnosis not present

## 2016-04-01 DIAGNOSIS — L821 Other seborrheic keratosis: Secondary | ICD-10-CM | POA: Diagnosis not present

## 2016-04-01 DIAGNOSIS — B078 Other viral warts: Secondary | ICD-10-CM | POA: Diagnosis not present

## 2016-04-01 DIAGNOSIS — L723 Sebaceous cyst: Secondary | ICD-10-CM | POA: Diagnosis not present

## 2016-04-01 DIAGNOSIS — L738 Other specified follicular disorders: Secondary | ICD-10-CM | POA: Diagnosis not present

## 2016-05-30 DIAGNOSIS — M1711 Unilateral primary osteoarthritis, right knee: Secondary | ICD-10-CM | POA: Diagnosis not present

## 2016-06-13 DIAGNOSIS — K140 Glossitis: Secondary | ICD-10-CM | POA: Diagnosis not present

## 2016-06-13 DIAGNOSIS — Z9109 Other allergy status, other than to drugs and biological substances: Secondary | ICD-10-CM | POA: Diagnosis not present

## 2016-06-23 DIAGNOSIS — L218 Other seborrheic dermatitis: Secondary | ICD-10-CM | POA: Diagnosis not present

## 2016-07-21 ENCOUNTER — Encounter: Payer: Self-pay | Admitting: Gastroenterology

## 2016-08-01 ENCOUNTER — Encounter: Payer: Self-pay | Admitting: Gastroenterology

## 2016-08-09 DIAGNOSIS — M79602 Pain in left arm: Secondary | ICD-10-CM | POA: Diagnosis not present

## 2016-08-18 DIAGNOSIS — D2271 Melanocytic nevi of right lower limb, including hip: Secondary | ICD-10-CM | POA: Diagnosis not present

## 2016-08-18 DIAGNOSIS — D225 Melanocytic nevi of trunk: Secondary | ICD-10-CM | POA: Diagnosis not present

## 2016-08-18 DIAGNOSIS — L821 Other seborrheic keratosis: Secondary | ICD-10-CM | POA: Diagnosis not present

## 2016-08-18 DIAGNOSIS — D1801 Hemangioma of skin and subcutaneous tissue: Secondary | ICD-10-CM | POA: Diagnosis not present

## 2016-08-18 DIAGNOSIS — L700 Acne vulgaris: Secondary | ICD-10-CM | POA: Diagnosis not present

## 2016-08-18 DIAGNOSIS — L84 Corns and callosities: Secondary | ICD-10-CM | POA: Diagnosis not present

## 2016-08-18 DIAGNOSIS — L814 Other melanin hyperpigmentation: Secondary | ICD-10-CM | POA: Diagnosis not present

## 2016-08-18 DIAGNOSIS — D2261 Melanocytic nevi of right upper limb, including shoulder: Secondary | ICD-10-CM | POA: Diagnosis not present

## 2016-08-18 DIAGNOSIS — D2272 Melanocytic nevi of left lower limb, including hip: Secondary | ICD-10-CM | POA: Diagnosis not present

## 2016-08-24 DIAGNOSIS — J0100 Acute maxillary sinusitis, unspecified: Secondary | ICD-10-CM | POA: Diagnosis not present

## 2016-08-29 DIAGNOSIS — M25561 Pain in right knee: Secondary | ICD-10-CM | POA: Diagnosis not present

## 2016-08-31 ENCOUNTER — Encounter: Payer: Self-pay | Admitting: Gynecology

## 2016-09-01 DIAGNOSIS — S83241A Other tear of medial meniscus, current injury, right knee, initial encounter: Secondary | ICD-10-CM | POA: Diagnosis not present

## 2016-09-05 ENCOUNTER — Encounter: Payer: Self-pay | Admitting: Gynecology

## 2016-09-05 DIAGNOSIS — Z1231 Encounter for screening mammogram for malignant neoplasm of breast: Secondary | ICD-10-CM | POA: Diagnosis not present

## 2016-09-05 DIAGNOSIS — Z803 Family history of malignant neoplasm of breast: Secondary | ICD-10-CM | POA: Diagnosis not present

## 2016-09-13 ENCOUNTER — Ambulatory Visit (AMBULATORY_SURGERY_CENTER): Payer: Self-pay

## 2016-09-13 VITALS — Ht 67.5 in | Wt 204.6 lb

## 2016-09-13 DIAGNOSIS — Z8371 Family history of colonic polyps: Secondary | ICD-10-CM

## 2016-09-13 MED ORDER — NA SULFATE-K SULFATE-MG SULF 17.5-3.13-1.6 GM/177ML PO SOLN: ORAL | 0 refills | Status: DC

## 2016-09-13 NOTE — Progress Notes (Signed)
Per pt, no allergies to soy or egg products.Pt not taking any weight loss meds or using  O2 at home.   Pt refused Emmi video. 

## 2016-09-15 DIAGNOSIS — L239 Allergic contact dermatitis, unspecified cause: Secondary | ICD-10-CM | POA: Diagnosis not present

## 2016-09-19 DIAGNOSIS — H5213 Myopia, bilateral: Secondary | ICD-10-CM | POA: Diagnosis not present

## 2016-09-22 DIAGNOSIS — Z0100 Encounter for examination of eyes and vision without abnormal findings: Secondary | ICD-10-CM | POA: Diagnosis not present

## 2016-09-23 ENCOUNTER — Telehealth: Payer: Self-pay | Admitting: Gastroenterology

## 2016-09-23 NOTE — Telephone Encounter (Signed)
Patient returned phone call. Best # [phone]. Procedure is on Tuesday.

## 2016-09-23 NOTE — Telephone Encounter (Signed)
Left message for patient to call back  

## 2016-09-23 NOTE — Telephone Encounter (Signed)
Patient notified that she does not need to hold her supplements

## 2016-09-27 ENCOUNTER — Ambulatory Visit (AMBULATORY_SURGERY_CENTER): Payer: Medicare HMO | Admitting: Gastroenterology

## 2016-09-27 ENCOUNTER — Encounter: Payer: Self-pay | Admitting: Gastroenterology

## 2016-09-27 VITALS — BP 139/76 | HR 75 | Temp 98.6°F | Resp 14 | Ht 67.0 in | Wt 201.0 lb

## 2016-09-27 DIAGNOSIS — K6389 Other specified diseases of intestine: Secondary | ICD-10-CM | POA: Diagnosis not present

## 2016-09-27 DIAGNOSIS — Z1211 Encounter for screening for malignant neoplasm of colon: Secondary | ICD-10-CM

## 2016-09-27 DIAGNOSIS — D124 Benign neoplasm of descending colon: Secondary | ICD-10-CM | POA: Diagnosis not present

## 2016-09-27 DIAGNOSIS — Z1212 Encounter for screening for malignant neoplasm of rectum: Secondary | ICD-10-CM | POA: Diagnosis not present

## 2016-09-27 DIAGNOSIS — D126 Benign neoplasm of colon, unspecified: Secondary | ICD-10-CM

## 2016-09-27 DIAGNOSIS — Z8371 Family history of colonic polyps: Secondary | ICD-10-CM | POA: Diagnosis not present

## 2016-09-27 DIAGNOSIS — D123 Benign neoplasm of transverse colon: Secondary | ICD-10-CM

## 2016-09-27 MED ORDER — SODIUM CHLORIDE 0.9 % IV SOLN: 500.0000 mL | INTRAVENOUS | Status: DC

## 2016-09-27 NOTE — Op Note (Signed)
[facility] Patient Name: Bentlee Drier Procedure Date: 09/27/2016 8:22 AM MRN: [id number] Endoscopist: Ladene Artist , MD Age: 70 Referring MD:  Date of Birth: January 31, 1946 Gender: Female Account #: [id number] Procedure:                Colonoscopy Indications:              Colon cancer screening in patient at increased                            risk: Family history of colon polyps in multiple                            1st-degree relatives Medicines:                Monitored Anesthesia Care Procedure:                Pre-Anesthesia Assessment:                           - Prior to the procedure, a History and Physical                            was performed, and patient medications and                            allergies were reviewed. The patient's tolerance of                            previous anesthesia was also reviewed. The risks                            and benefits of the procedure and the sedation                            options and risks were discussed with the patient.                            All questions were answered, and informed consent                            was obtained. Prior Anticoagulants: The patient has                            taken no previous anticoagulant or antiplatelet                            agents. ASA Grade Assessment: II - A patient with                            mild systemic disease. After reviewing the risks                            and benefits, the patient was deemed in  satisfactory condition to undergo the procedure.                           After obtaining informed consent, the colonoscope                            was passed under direct vision. Throughout the                            procedure, the patient's blood pressure, pulse, and                            oxygen saturations were monitored continuously. The                            Colonoscope was introduced through the  anus and                            advanced to the the cecum, identified by                            appendiceal orifice and ileocecal valve. The                            ileocecal valve, appendiceal orifice, and rectum                            were photographed. The quality of the bowel                            preparation was good. The patient tolerated the                            procedure well. The colonoscopy was somewhat                            difficult due to a tortuous colon. Successful                            completion of the procedure was aided by using                            manual pressure, withdrawing and reinserting the                            scope and straightening and shortening the scope to                            obtain bowel loop reduction. Scope In: 8:31:08 AM Scope Out: 8:53:13 AM Scope Withdrawal Time: 0 hours 8 minutes 57 seconds  Total Procedure Duration: 0 hours 22 minutes 5 seconds  Findings:                 The perianal and digital rectal examinations were  normal.                           Two sessile polyps were found in the descending                            colon and transverse colon. The polyps were 6 mm in                            size. These polyps were removed with a cold snare.                            Resection and retrieval were complete.                           Internal hemorrhoids were found during                            retroflexion. The hemorrhoids were small and Grade                            I (internal hemorrhoids that do not prolapse).                           The exam was otherwise without abnormality on                            direct and retroflexion views. Complications:            No immediate complications. Estimated blood loss:                            None. Estimated Blood Loss:     Estimated blood loss: none. Impression:               - Two 6 mm polyps in  the descending colon and in                            the transverse colon, removed with a cold snare.                            Resected and retrieved.                           - Internal hemorrhoids.                           - The examination was otherwise normal on direct                            and retroflexion views. Recommendation:           - Repeat colonoscopy in 5 years for surveillance.                           - Patient has a contact number available for  emergencies. The signs and symptoms of potential                            delayed complications were discussed with the                            patient. Return to normal activities tomorrow.                            Written discharge instructions were provided to the                            patient.                           - Resume previous diet.                           - Continue present medications.                           - Await pathology results. Ladene Artist, MD 09/27/2016 8:56:40 AM This report has been signed electronically.

## 2016-09-27 NOTE — Progress Notes (Signed)
Called to room to assist during endoscopic procedure.  Patient ID and intended procedure confirmed with present staff. Received instructions for my participation in the procedure from the performing physician.  

## 2016-09-27 NOTE — Patient Instructions (Signed)
YOU HAD AN ENDOSCOPIC PROCEDURE TODAY AT THE [facility]:   Refer to the procedure report that was given to you for any specific questions about what was found during the examination.  If the procedure report does not answer your questions, please call your gastroenterologist to clarify.  If you requested that your care partner not be given the details of your procedure findings, then the procedure report has been included in a sealed envelope for you to review at your convenience later.  YOU SHOULD EXPECT: Some feelings of bloating in the abdomen. Passage of more gas than usual.  Walking can help get rid of the air that was put into your GI tract during the procedure and reduce the bloating. If you had a lower endoscopy (such as a colonoscopy or flexible sigmoidoscopy) you may notice spotting of blood in your stool or on the toilet paper. If you underwent a bowel prep for your procedure, you may not have a normal bowel movement for a few days.  Please Note:  You might notice some irritation and congestion in your nose or some drainage.  This is from the oxygen used during your procedure.  There is no need for concern and it should clear up in a day or so.  SYMPTOMS TO REPORT IMMEDIATELY:   Following lower endoscopy (colonoscopy or flexible sigmoidoscopy):  Excessive amounts of blood in the stool  Significant tenderness or worsening of abdominal pains  Swelling of the abdomen that is new, acute  Fever of 100F or higher   For urgent or emergent issues, a gastroenterologist can be reached at any hour by calling [phone].   DIET:  We do recommend a small meal at first, but then you may proceed to your regular diet.  Drink plenty of fluids but you should avoid alcoholic beverages for 24 hours.  ACTIVITY:  You should plan to take it easy for the rest of today and you should NOT DRIVE or use heavy machinery until tomorrow (because of the sedation medicines used during the test).     FOLLOW UP: Our staff will call the number listed on your records the next business day following your procedure to check on you and address any questions or concerns that you may have regarding the information given to you following your procedure. If we do not reach you, we will leave a message.  However, if you are feeling well and you are not experiencing any problems, there is no need to return our call.  We will assume that you have returned to your regular daily activities without incident.  If any biopsies were taken you will be contacted by phone or by letter within the next 1-3 weeks.  Please call us at [phone] if you have not heard about the biopsies in 3 weeks.    SIGNATURES/CONFIDENTIALITY: You and/or your care partner have signed paperwork which will be entered into your electronic medical record.  These signatures attest to the fact that that the information above on your After Visit Summary has been reviewed and is understood.  Full responsibility of the confidentiality of this discharge information lies with you and/or your care-partner.  Read all of the handouts given to you by your recovery room nurse. 

## 2016-09-27 NOTE — Progress Notes (Signed)
A and O x3. Report to RN. Tolerated MAC anesthesia well.

## 2016-09-28 ENCOUNTER — Telehealth: Payer: Self-pay | Admitting: *Deleted

## 2016-09-28 NOTE — Telephone Encounter (Signed)
  Follow up Call-  Call back number 09/27/2016  Post procedure Call Back phone  # [phone]  Permission to leave phone message Yes  Some recent data might be hidden     Patient questions:  Do you have a fever, pain , or abdominal swelling? No. Pain Score  0 *  Have you tolerated food without any problems? Yes.    Have you been able to return to your normal activities? Yes.    Do you have any questions about your discharge instructions: Diet   No. Medications  No. Follow up visit  No.  Do you have questions or concerns about your Care? No.  Actions: * If pain score is 4 or above: No action needed, pain <4.  Pt. Had questions about path results,explained process to pt. And she verbalize understanding.

## 2016-10-04 ENCOUNTER — Encounter: Payer: Self-pay | Admitting: Gastroenterology

## 2016-10-13 DIAGNOSIS — H1089 Other conjunctivitis: Secondary | ICD-10-CM | POA: Diagnosis not present

## 2016-10-25 ENCOUNTER — Encounter: Payer: Commercial Managed Care - HMO | Admitting: Women's Health

## 2016-10-31 ENCOUNTER — Encounter: Payer: Self-pay | Admitting: Women's Health

## 2016-10-31 ENCOUNTER — Ambulatory Visit (INDEPENDENT_AMBULATORY_CARE_PROVIDER_SITE_OTHER): Payer: Medicare HMO | Admitting: Women's Health

## 2016-10-31 VITALS — BP 130/80 | Ht 67.5 in | Wt 199.0 lb

## 2016-10-31 DIAGNOSIS — Z01419 Encounter for gynecological examination (general) (routine) without abnormal findings: Secondary | ICD-10-CM | POA: Diagnosis not present

## 2016-10-31 NOTE — Patient Instructions (Signed)
Health Maintenance for Postmenopausal Women Menopause is a normal process in which your reproductive ability comes to an end. This process happens gradually over a span of months to years, usually between the ages of 22 and 9. Menopause is complete when you have missed 12 consecutive menstrual periods. It is important to talk with your health care provider about some of the most common conditions that affect postmenopausal women, such as heart disease, cancer, and bone loss (osteoporosis). Adopting a healthy lifestyle and getting preventive care can help to promote your health and wellness. Those actions can also lower your chances of developing some of these common conditions. What should I know about menopause? During menopause, you may experience a number of symptoms, such as:  Moderate-to-severe hot flashes.  Night sweats.  Decrease in sex drive.  Mood swings.  Headaches.  Tiredness.  Irritability.  Memory problems.  Insomnia.  Choosing to treat or not to treat menopausal changes is an individual decision that you make with your health care provider. What should I know about hormone replacement therapy and supplements? Hormone therapy products are effective for treating symptoms that are associated with menopause, such as hot flashes and night sweats. Hormone replacement carries certain risks, especially as you become older. If you are thinking about using estrogen or estrogen with progestin treatments, discuss the benefits and risks with your health care provider. What should I know about heart disease and stroke? Heart disease, heart attack, and stroke become more likely as you age. This may be due, in part, to the hormonal changes that your body experiences during menopause. These can affect how your body processes dietary fats, triglycerides, and cholesterol. Heart attack and stroke are both medical emergencies. There are many things that you can do to help prevent heart disease  and stroke:  Have your blood pressure checked at least every 1-2 years. High blood pressure causes heart disease and increases the risk of stroke.  If you are 53-22 years old, ask your health care provider if you should take aspirin to prevent a heart attack or a stroke.  Do not use any tobacco products, including cigarettes, chewing tobacco, or electronic cigarettes. If you need help quitting, ask your health care provider.  It is important to eat a healthy diet and maintain a healthy weight. ? Be sure to include plenty of vegetables, fruits, low-fat dairy products, and lean protein. ? Avoid eating foods that are high in solid fats, added sugars, or salt (sodium).  Get regular exercise. This is one of the most important things that you can do for your health. ? Try to exercise for at least 150 minutes each week. The type of exercise that you do should increase your heart rate and make you sweat. This is known as moderate-intensity exercise. ? Try to do strengthening exercises at least twice each week. Do these in addition to the moderate-intensity exercise.  Know your numbers.Ask your health care provider to check your cholesterol and your blood glucose. Continue to have your blood tested as directed by your health care provider.  What should I know about cancer screening? There are several types of cancer. Take the following steps to reduce your risk and to catch any cancer development as early as possible. Breast Cancer  Practice breast self-awareness. ? This means understanding how your breasts normally appear and feel. ? It also means doing regular breast self-exams. Let your health care provider know about any changes, no matter how small.  If you are 40  or older, have a clinician do a breast exam (clinical breast exam or CBE) every year. Depending on your age, family history, and medical history, it may be recommended that you also have a yearly breast X-ray (mammogram).  If you  have a family history of breast cancer, talk with your health care provider about genetic screening.  If you are at high risk for breast cancer, talk with your health care provider about having an MRI and a mammogram every year.  Breast cancer (BRCA) gene test is recommended for women who have family members with BRCA-related cancers. Results of the assessment will determine the need for genetic counseling and BRCA1 and for BRCA2 testing. BRCA-related cancers include these types: ? Breast. This occurs in males or females. ? Ovarian. ? Tubal. This may also be called fallopian tube cancer. ? Cancer of the abdominal or pelvic lining (peritoneal cancer). ? Prostate. ? Pancreatic.  Cervical, Uterine, and Ovarian Cancer Your health care provider may recommend that you be screened regularly for cancer of the pelvic organs. These include your ovaries, uterus, and vagina. This screening involves a pelvic exam, which includes checking for microscopic changes to the surface of your cervix (Pap test).  For women ages 21-65, health care providers may recommend a pelvic exam and a Pap test every three years. For women ages 79-65, they may recommend the Pap test and pelvic exam, combined with testing for human papilloma virus (HPV), every five years. Some types of HPV increase your risk of cervical cancer. Testing for HPV may also be done on women of any age who have unclear Pap test results.  Other health care providers may not recommend any screening for nonpregnant women who are considered low risk for pelvic cancer and have no symptoms. Ask your health care provider if a screening pelvic exam is right for you.  If you have had past treatment for cervical cancer or a condition that could lead to cancer, you need Pap tests and screening for cancer for at least 20 years after your treatment. If Pap tests have been discontinued for you, your risk factors (such as having a new sexual partner) need to be  reassessed to determine if you should start having screenings again. Some women have medical problems that increase the chance of getting cervical cancer. In these cases, your health care provider may recommend that you have screening and Pap tests more often.  If you have a family history of uterine cancer or ovarian cancer, talk with your health care provider about genetic screening.  If you have vaginal bleeding after reaching menopause, tell your health care provider.  There are currently no reliable tests available to screen for ovarian cancer.  Lung Cancer Lung cancer screening is recommended for adults 69-62 years old who are at high risk for lung cancer because of a history of smoking. A yearly low-dose CT scan of the lungs is recommended if you:  Currently smoke.  Have a history of at least 30 pack-years of smoking and you currently smoke or have quit within the past 15 years. A pack-year is smoking an average of one pack of cigarettes per day for one year.  Yearly screening should:  Continue until it has been 15 years since you quit.  Stop if you develop a health problem that would prevent you from having lung cancer treatment.  Colorectal Cancer  This type of cancer can be detected and can often be prevented.  Routine colorectal cancer screening usually begins at  age 42 and continues through age 45.  If you have risk factors for colon cancer, your health care provider may recommend that you be screened at an earlier age.  If you have a family history of colorectal cancer, talk with your health care provider about genetic screening.  Your health care provider may also recommend using home test kits to check for hidden blood in your stool.  A small camera at the end of a tube can be used to examine your colon directly (sigmoidoscopy or colonoscopy). This is done to check for the earliest forms of colorectal cancer.  Direct examination of the colon should be repeated every  5-10 years until age 71. However, if early forms of precancerous polyps or small growths are found or if you have a family history or genetic risk for colorectal cancer, you may need to be screened more often.  Skin Cancer  Check your skin from head to toe regularly.  Monitor any moles. Be sure to tell your health care provider: ? About any new moles or changes in moles, especially if there is a change in a mole's shape or color. ? If you have a mole that is larger than the size of a pencil eraser.  If any of your family members has a history of skin cancer, especially at a Louna Rothgeb age, talk with your health care provider about genetic screening.  Always use sunscreen. Apply sunscreen liberally and repeatedly throughout the day.  Whenever you are outside, protect yourself by wearing long sleeves, pants, a wide-brimmed hat, and sunglasses.  What should I know about osteoporosis? Osteoporosis is a condition in which bone destruction happens more quickly than new bone creation. After menopause, you may be at an increased risk for osteoporosis. To help prevent osteoporosis or the bone fractures that can happen because of osteoporosis, the following is recommended:  If you are 46-71 years old, get at least 1,000 mg of calcium and at least 600 mg of vitamin D per day.  If you are older than age 55 but younger than age 65, get at least 1,200 mg of calcium and at least 600 mg of vitamin D per day.  If you are older than age 54, get at least 1,200 mg of calcium and at least 800 mg of vitamin D per day.  Smoking and excessive alcohol intake increase the risk of osteoporosis. Eat foods that are rich in calcium and vitamin D, and do weight-bearing exercises several times each week as directed by your health care provider. What should I know about how menopause affects my mental health? Depression may occur at any age, but it is more common as you become older. Common symptoms of depression  include:  Low or sad mood.  Changes in sleep patterns.  Changes in appetite or eating patterns.  Feeling an overall lack of motivation or enjoyment of activities that you previously enjoyed.  Frequent crying spells.  Talk with your health care provider if you think that you are experiencing depression. What should I know about immunizations? It is important that you get and maintain your immunizations. These include:  Tetanus, diphtheria, and pertussis (Tdap) booster vaccine.  Influenza every year before the flu season begins.  Pneumonia vaccine.  Shingles vaccine.  Your health care provider may also recommend other immunizations. This information is not intended to replace advice given to you by your health care provider. Make sure you discuss any questions you have with your health care provider. Document Released: 05/27/2005  Document Revised: 10/23/2015 Document Reviewed: 01/06/2015 Elsevier Interactive Patient Education  2018 [employer] Inc[employer]  

## 2016-10-31 NOTE — Progress Notes (Signed)
Kelly Quinn Sep 11, 1945 [id number]    History:    Presents for breast and pelvic exam. Normal Pap and mammogram history, up-to-date on mammograms. Not sexually active. History of osteoporosis had been on Fosamax for 5 years in the past. Primary care manages labs and meds. 09/2016 colonoscopy tube polyps 5 year follow-up. Having problems with her right knee is planning surgery which has interfered with her exercise. Has had Prevnar 13 and Zostavax. HSV-1 rare outbreaks.  ast medical history, past surgical history, family history and social history were all reviewed and documented in the EPIC chart. Retired from the [employer]. Volunteers with [employer]. One sister with breast cancer. One brother with diabetes. One sister with Alzheimer's deceased.   ROS:  A ROS was performed and pertinent positives and negatives are included.  Exam:  Vitals:   10/31/16 0818  BP: 130/80  Weight: 199 lb (90.3 kg)  Height: 5' 7.5" (1.715 m)   Body mass index is 30.71 kg/m.   General appearance:  Normal Thyroid:  Symmetrical, normal in size, without palpable masses or nodularity. Respiratory  Auscultation:  Clear without wheezing or rhonchi Cardiovascular  Auscultation:  Regular rate, without rubs, murmurs or gallops  Edema/varicosities:  Not grossly evident Abdominal  Soft,nontender, without masses, guarding or rebound.  Liver/spleen:  No organomegaly noted  Hernia:  None appreciated  Skin  Inspection:  Grossly normal   Breasts: Examined lying and sitting.     Right: Without masses, retractions, discharge or axillary adenopathy.     Left: Without masses, retractions, discharge or axillary adenopathy. Gentitourinary   Inguinal/mons:  Normal without inguinal adenopathy  External genitalia:  Normal  BUS/Urethra/Skene's glands:  Normal  Vagina: Atrophic  Cervix:  Normal  Uterus:   normal in size, shape and contour.  Midline and mobile  Adnexa/parametria:     Rt: Without masses or  tenderness.   Lt: Without masses or tenderness.  Anus and perineum: Normal  Digital rectal exam: Normal sphincter tone without palpated masses or tenderness  Assessment/Plan:  71 y.o. SBF G1 P1  forbreast and pelvic exam with no complaints.  Postmenopausal/no HRT/no bleeding Osteoporosis 5 year completion of Fosamax-primary care manages 09/2016 2 polyps 5. Follow-up HSV-1 rare outbreaks Hypertension-primary care manages labs and meds  Plan: Home safety, fall prevention and importance of weightbearing exercise as able reviewed. SBE's, continue annual screening mammogram. Calcium rich diet, vitamin D 2000 daily encouraged. Pap screenings reviewed, no Pap done. Condoms encouraged if becomes sexually active.   [employer], 8:48 AM 10/31/2016

## 2016-11-01 LAB — URINALYSIS W MICROSCOPIC + REFLEX CULTURE
BILIRUBIN URINE: NEGATIVE
Bacteria, UA: NONE SEEN [HPF]
CASTS: NONE SEEN [LPF]
Crystals: NONE SEEN [HPF]
Glucose, UA: NEGATIVE
HGB URINE DIPSTICK: NEGATIVE
KETONES UR: NEGATIVE
Leukocytes, UA: NEGATIVE
NITRITE: NEGATIVE
PH: 7 (ref 5.0–8.0)
Protein, ur: NEGATIVE
RBC / HPF: NONE SEEN RBC/HPF (ref <=2)
SPECIFIC GRAVITY, URINE: 1.011 (ref 1.001–1.035)
SQUAMOUS EPITHELIAL / LPF: NONE SEEN [HPF] (ref <=5)
WBC UA: NONE SEEN WBC/HPF (ref <=5)
Yeast: NONE SEEN [HPF]

## 2016-11-03 DIAGNOSIS — Z0000 Encounter for general adult medical examination without abnormal findings: Secondary | ICD-10-CM | POA: Diagnosis not present

## 2016-11-03 DIAGNOSIS — R030 Elevated blood-pressure reading, without diagnosis of hypertension: Secondary | ICD-10-CM | POA: Diagnosis not present

## 2016-11-03 DIAGNOSIS — Z23 Encounter for immunization: Secondary | ICD-10-CM | POA: Diagnosis not present

## 2016-11-03 DIAGNOSIS — Z1159 Encounter for screening for other viral diseases: Secondary | ICD-10-CM | POA: Diagnosis not present

## 2016-11-03 DIAGNOSIS — M8580 Other specified disorders of bone density and structure, unspecified site: Secondary | ICD-10-CM | POA: Diagnosis not present

## 2016-11-03 DIAGNOSIS — J309 Allergic rhinitis, unspecified: Secondary | ICD-10-CM | POA: Diagnosis not present

## 2016-11-16 DIAGNOSIS — S83281A Other tear of lateral meniscus, current injury, right knee, initial encounter: Secondary | ICD-10-CM | POA: Diagnosis not present

## 2016-11-16 DIAGNOSIS — S83241A Other tear of medial meniscus, current injury, right knee, initial encounter: Secondary | ICD-10-CM | POA: Diagnosis not present

## 2016-11-16 DIAGNOSIS — M94261 Chondromalacia, right knee: Secondary | ICD-10-CM | POA: Diagnosis not present

## 2016-11-16 DIAGNOSIS — G8918 Other acute postprocedural pain: Secondary | ICD-10-CM | POA: Diagnosis not present

## 2016-11-24 DIAGNOSIS — S83281D Other tear of lateral meniscus, current injury, right knee, subsequent encounter: Secondary | ICD-10-CM | POA: Diagnosis not present

## 2016-11-24 DIAGNOSIS — S83241D Other tear of medial meniscus, current injury, right knee, subsequent encounter: Secondary | ICD-10-CM | POA: Diagnosis not present

## 2016-11-25 DIAGNOSIS — R739 Hyperglycemia, unspecified: Secondary | ICD-10-CM | POA: Diagnosis not present

## 2016-12-08 DIAGNOSIS — E119 Type 2 diabetes mellitus without complications: Secondary | ICD-10-CM | POA: Diagnosis not present

## 2016-12-15 DIAGNOSIS — S83281D Other tear of lateral meniscus, current injury, right knee, subsequent encounter: Secondary | ICD-10-CM | POA: Diagnosis not present

## 2016-12-15 DIAGNOSIS — S83241D Other tear of medial meniscus, current injury, right knee, subsequent encounter: Secondary | ICD-10-CM | POA: Diagnosis not present

## 2016-12-21 DIAGNOSIS — M25661 Stiffness of right knee, not elsewhere classified: Secondary | ICD-10-CM | POA: Diagnosis not present

## 2016-12-21 DIAGNOSIS — R262 Difficulty in walking, not elsewhere classified: Secondary | ICD-10-CM | POA: Diagnosis not present

## 2016-12-21 DIAGNOSIS — M25561 Pain in right knee: Secondary | ICD-10-CM | POA: Diagnosis not present

## 2016-12-21 DIAGNOSIS — L218 Other seborrheic dermatitis: Secondary | ICD-10-CM | POA: Diagnosis not present

## 2016-12-21 DIAGNOSIS — R21 Rash and other nonspecific skin eruption: Secondary | ICD-10-CM | POA: Diagnosis not present

## 2016-12-21 DIAGNOSIS — R531 Weakness: Secondary | ICD-10-CM | POA: Diagnosis not present

## 2016-12-21 DIAGNOSIS — L820 Inflamed seborrheic keratosis: Secondary | ICD-10-CM | POA: Diagnosis not present

## 2016-12-22 DIAGNOSIS — E1351 Other specified diabetes mellitus with diabetic peripheral angiopathy without gangrene: Secondary | ICD-10-CM | POA: Diagnosis not present

## 2016-12-22 DIAGNOSIS — M21962 Unspecified acquired deformity of left lower leg: Secondary | ICD-10-CM | POA: Diagnosis not present

## 2016-12-22 DIAGNOSIS — M21961 Unspecified acquired deformity of right lower leg: Secondary | ICD-10-CM | POA: Diagnosis not present

## 2016-12-26 DIAGNOSIS — M25561 Pain in right knee: Secondary | ICD-10-CM | POA: Diagnosis not present

## 2016-12-26 DIAGNOSIS — R262 Difficulty in walking, not elsewhere classified: Secondary | ICD-10-CM | POA: Diagnosis not present

## 2016-12-26 DIAGNOSIS — R531 Weakness: Secondary | ICD-10-CM | POA: Diagnosis not present

## 2016-12-26 DIAGNOSIS — M25661 Stiffness of right knee, not elsewhere classified: Secondary | ICD-10-CM | POA: Diagnosis not present

## 2016-12-28 DIAGNOSIS — M25561 Pain in right knee: Secondary | ICD-10-CM | POA: Diagnosis not present

## 2016-12-28 DIAGNOSIS — R531 Weakness: Secondary | ICD-10-CM | POA: Diagnosis not present

## 2016-12-28 DIAGNOSIS — M25661 Stiffness of right knee, not elsewhere classified: Secondary | ICD-10-CM | POA: Diagnosis not present

## 2016-12-28 DIAGNOSIS — R262 Difficulty in walking, not elsewhere classified: Secondary | ICD-10-CM | POA: Diagnosis not present

## 2017-01-04 DIAGNOSIS — M25661 Stiffness of right knee, not elsewhere classified: Secondary | ICD-10-CM | POA: Diagnosis not present

## 2017-01-04 DIAGNOSIS — R531 Weakness: Secondary | ICD-10-CM | POA: Diagnosis not present

## 2017-01-04 DIAGNOSIS — M25561 Pain in right knee: Secondary | ICD-10-CM | POA: Diagnosis not present

## 2017-01-04 DIAGNOSIS — R262 Difficulty in walking, not elsewhere classified: Secondary | ICD-10-CM | POA: Diagnosis not present

## 2017-01-09 DIAGNOSIS — R262 Difficulty in walking, not elsewhere classified: Secondary | ICD-10-CM | POA: Diagnosis not present

## 2017-01-09 DIAGNOSIS — R531 Weakness: Secondary | ICD-10-CM | POA: Diagnosis not present

## 2017-01-09 DIAGNOSIS — M25561 Pain in right knee: Secondary | ICD-10-CM | POA: Diagnosis not present

## 2017-01-09 DIAGNOSIS — M25661 Stiffness of right knee, not elsewhere classified: Secondary | ICD-10-CM | POA: Diagnosis not present

## 2017-01-10 DIAGNOSIS — R21 Rash and other nonspecific skin eruption: Secondary | ICD-10-CM | POA: Diagnosis not present

## 2017-01-10 DIAGNOSIS — R220 Localized swelling, mass and lump, head: Secondary | ICD-10-CM | POA: Diagnosis not present

## 2017-01-11 DIAGNOSIS — M25661 Stiffness of right knee, not elsewhere classified: Secondary | ICD-10-CM | POA: Diagnosis not present

## 2017-01-11 DIAGNOSIS — R262 Difficulty in walking, not elsewhere classified: Secondary | ICD-10-CM | POA: Diagnosis not present

## 2017-01-11 DIAGNOSIS — R531 Weakness: Secondary | ICD-10-CM | POA: Diagnosis not present

## 2017-01-11 DIAGNOSIS — M25561 Pain in right knee: Secondary | ICD-10-CM | POA: Diagnosis not present

## 2017-01-12 ENCOUNTER — Ambulatory Visit (HOSPITAL_COMMUNITY)
Admission: RE | Admit: 2017-01-12 | Discharge: 2017-01-12 | Disposition: A | Discharge status: Home / Self Care | Payer: Medicare HMO | Source: Ambulatory Visit | Attending: Cardiology | Admitting: Cardiology

## 2017-01-12 ENCOUNTER — Other Ambulatory Visit (HOSPITAL_COMMUNITY): Payer: Self-pay | Admitting: Orthopedic Surgery

## 2017-01-12 DIAGNOSIS — M25561 Pain in right knee: Secondary | ICD-10-CM | POA: Diagnosis not present

## 2017-01-12 DIAGNOSIS — R52 Pain, unspecified: Secondary | ICD-10-CM

## 2017-01-12 DIAGNOSIS — M79604 Pain in right leg: Secondary | ICD-10-CM | POA: Insufficient documentation

## 2017-01-13 ENCOUNTER — Encounter (HOSPITAL_COMMUNITY): Payer: Self-pay

## 2017-01-13 NOTE — Progress Notes (Signed)
Right lower extremity venous duplex is negative for DVT on 01/12/17. Preliminary results given to Dr. Noemi Chapel.

## 2017-01-16 DIAGNOSIS — R262 Difficulty in walking, not elsewhere classified: Secondary | ICD-10-CM | POA: Diagnosis not present

## 2017-01-16 DIAGNOSIS — M25561 Pain in right knee: Secondary | ICD-10-CM | POA: Diagnosis not present

## 2017-01-16 DIAGNOSIS — R531 Weakness: Secondary | ICD-10-CM | POA: Diagnosis not present

## 2017-01-16 DIAGNOSIS — M25661 Stiffness of right knee, not elsewhere classified: Secondary | ICD-10-CM | POA: Diagnosis not present

## 2017-01-17 DIAGNOSIS — L509 Urticaria, unspecified: Secondary | ICD-10-CM | POA: Diagnosis not present

## 2017-01-17 DIAGNOSIS — J3089 Other allergic rhinitis: Secondary | ICD-10-CM | POA: Diagnosis not present

## 2017-01-17 DIAGNOSIS — T783XXA Angioneurotic edema, initial encounter: Secondary | ICD-10-CM | POA: Diagnosis not present

## 2017-01-17 DIAGNOSIS — J301 Allergic rhinitis due to pollen: Secondary | ICD-10-CM | POA: Diagnosis not present

## 2017-01-18 DIAGNOSIS — R531 Weakness: Secondary | ICD-10-CM | POA: Diagnosis not present

## 2017-01-18 DIAGNOSIS — M25561 Pain in right knee: Secondary | ICD-10-CM | POA: Diagnosis not present

## 2017-01-18 DIAGNOSIS — M25611 Stiffness of right shoulder, not elsewhere classified: Secondary | ICD-10-CM | POA: Diagnosis not present

## 2017-01-18 DIAGNOSIS — R262 Difficulty in walking, not elsewhere classified: Secondary | ICD-10-CM | POA: Diagnosis not present

## 2017-01-23 DIAGNOSIS — R262 Difficulty in walking, not elsewhere classified: Secondary | ICD-10-CM | POA: Diagnosis not present

## 2017-01-23 DIAGNOSIS — M25561 Pain in right knee: Secondary | ICD-10-CM | POA: Diagnosis not present

## 2017-01-23 DIAGNOSIS — R531 Weakness: Secondary | ICD-10-CM | POA: Diagnosis not present

## 2017-01-23 DIAGNOSIS — M25661 Stiffness of right knee, not elsewhere classified: Secondary | ICD-10-CM | POA: Diagnosis not present

## 2017-01-25 DIAGNOSIS — R262 Difficulty in walking, not elsewhere classified: Secondary | ICD-10-CM | POA: Diagnosis not present

## 2017-01-25 DIAGNOSIS — R531 Weakness: Secondary | ICD-10-CM | POA: Diagnosis not present

## 2017-01-25 DIAGNOSIS — M25661 Stiffness of right knee, not elsewhere classified: Secondary | ICD-10-CM | POA: Diagnosis not present

## 2017-01-25 DIAGNOSIS — M25561 Pain in right knee: Secondary | ICD-10-CM | POA: Diagnosis not present

## 2017-01-30 DIAGNOSIS — R262 Difficulty in walking, not elsewhere classified: Secondary | ICD-10-CM | POA: Diagnosis not present

## 2017-01-30 DIAGNOSIS — M25661 Stiffness of right knee, not elsewhere classified: Secondary | ICD-10-CM | POA: Diagnosis not present

## 2017-01-30 DIAGNOSIS — M25561 Pain in right knee: Secondary | ICD-10-CM | POA: Diagnosis not present

## 2017-01-30 DIAGNOSIS — R531 Weakness: Secondary | ICD-10-CM | POA: Diagnosis not present

## 2017-02-01 DIAGNOSIS — M25661 Stiffness of right knee, not elsewhere classified: Secondary | ICD-10-CM | POA: Diagnosis not present

## 2017-02-01 DIAGNOSIS — M25561 Pain in right knee: Secondary | ICD-10-CM | POA: Diagnosis not present

## 2017-02-01 DIAGNOSIS — R531 Weakness: Secondary | ICD-10-CM | POA: Diagnosis not present

## 2017-02-01 DIAGNOSIS — R262 Difficulty in walking, not elsewhere classified: Secondary | ICD-10-CM | POA: Diagnosis not present

## 2017-02-06 DIAGNOSIS — R531 Weakness: Secondary | ICD-10-CM | POA: Diagnosis not present

## 2017-02-06 DIAGNOSIS — M25661 Stiffness of right knee, not elsewhere classified: Secondary | ICD-10-CM | POA: Diagnosis not present

## 2017-02-06 DIAGNOSIS — R262 Difficulty in walking, not elsewhere classified: Secondary | ICD-10-CM | POA: Diagnosis not present

## 2017-02-06 DIAGNOSIS — M25561 Pain in right knee: Secondary | ICD-10-CM | POA: Diagnosis not present

## 2017-02-08 DIAGNOSIS — R262 Difficulty in walking, not elsewhere classified: Secondary | ICD-10-CM | POA: Diagnosis not present

## 2017-02-08 DIAGNOSIS — M25561 Pain in right knee: Secondary | ICD-10-CM | POA: Diagnosis not present

## 2017-02-08 DIAGNOSIS — R531 Weakness: Secondary | ICD-10-CM | POA: Diagnosis not present

## 2017-02-08 DIAGNOSIS — M25661 Stiffness of right knee, not elsewhere classified: Secondary | ICD-10-CM | POA: Diagnosis not present

## 2017-02-09 DIAGNOSIS — M25561 Pain in right knee: Secondary | ICD-10-CM | POA: Diagnosis not present

## 2017-02-13 DIAGNOSIS — M25561 Pain in right knee: Secondary | ICD-10-CM | POA: Diagnosis not present

## 2017-02-13 DIAGNOSIS — R262 Difficulty in walking, not elsewhere classified: Secondary | ICD-10-CM | POA: Diagnosis not present

## 2017-02-13 DIAGNOSIS — M25661 Stiffness of right knee, not elsewhere classified: Secondary | ICD-10-CM | POA: Diagnosis not present

## 2017-02-13 DIAGNOSIS — R531 Weakness: Secondary | ICD-10-CM | POA: Diagnosis not present

## 2017-02-15 DIAGNOSIS — M25661 Stiffness of right knee, not elsewhere classified: Secondary | ICD-10-CM | POA: Diagnosis not present

## 2017-02-15 DIAGNOSIS — R262 Difficulty in walking, not elsewhere classified: Secondary | ICD-10-CM | POA: Diagnosis not present

## 2017-02-15 DIAGNOSIS — R531 Weakness: Secondary | ICD-10-CM | POA: Diagnosis not present

## 2017-02-15 DIAGNOSIS — M25561 Pain in right knee: Secondary | ICD-10-CM | POA: Diagnosis not present

## 2017-02-20 DIAGNOSIS — R262 Difficulty in walking, not elsewhere classified: Secondary | ICD-10-CM | POA: Diagnosis not present

## 2017-02-20 DIAGNOSIS — M25661 Stiffness of right knee, not elsewhere classified: Secondary | ICD-10-CM | POA: Diagnosis not present

## 2017-02-20 DIAGNOSIS — R531 Weakness: Secondary | ICD-10-CM | POA: Diagnosis not present

## 2017-02-20 DIAGNOSIS — M25561 Pain in right knee: Secondary | ICD-10-CM | POA: Diagnosis not present

## 2017-02-22 DIAGNOSIS — R262 Difficulty in walking, not elsewhere classified: Secondary | ICD-10-CM | POA: Diagnosis not present

## 2017-02-22 DIAGNOSIS — R531 Weakness: Secondary | ICD-10-CM | POA: Diagnosis not present

## 2017-02-22 DIAGNOSIS — M25561 Pain in right knee: Secondary | ICD-10-CM | POA: Diagnosis not present

## 2017-02-22 DIAGNOSIS — M25661 Stiffness of right knee, not elsewhere classified: Secondary | ICD-10-CM | POA: Diagnosis not present

## 2017-02-27 DIAGNOSIS — H1032 Unspecified acute conjunctivitis, left eye: Secondary | ICD-10-CM | POA: Diagnosis not present

## 2017-03-16 DIAGNOSIS — M25561 Pain in right knee: Secondary | ICD-10-CM | POA: Diagnosis not present

## 2017-03-17 DIAGNOSIS — E119 Type 2 diabetes mellitus without complications: Secondary | ICD-10-CM | POA: Diagnosis not present

## 2017-03-23 DIAGNOSIS — L602 Onychogryphosis: Secondary | ICD-10-CM | POA: Diagnosis not present

## 2017-03-23 DIAGNOSIS — L84 Corns and callosities: Secondary | ICD-10-CM | POA: Diagnosis not present

## 2017-04-07 ENCOUNTER — Encounter: Payer: Medicare HMO | Attending: Family Medicine | Admitting: Registered"

## 2017-04-07 ENCOUNTER — Encounter: Payer: Self-pay | Admitting: Registered"

## 2017-04-07 DIAGNOSIS — E119 Type 2 diabetes mellitus without complications: Secondary | ICD-10-CM | POA: Insufficient documentation

## 2017-04-07 NOTE — Progress Notes (Signed)
Diabetes Self-Management Education  Visit Type: First/Initial  Appt. Start Time: 0830 Appt. End Time: 0945  04/07/2017  [name], identified by name and date of birth, is a 71 y.o. female with a diagnosis of Diabetes: Type 2.   ASSESSMENT Patient states she was diagnosed with T2DM at the time of surgery in August. At that time patient states she made some changes in diet including less sweet tea and cut out french fries. Patient states she enjoys vegetables.    Patient states she likes to stay active but has not been able to do much in last 4 months recovering from knee surgery. Pt reports enjoying line dancing, kick boxing, walking.  Patient states she gets up 3 or 4 am most mornings and if doesn't get a afternoon nap, falls asleep watching TV around 8:30, but wakes up couple of hours later, not getting restful sleep.  Patiet states energy level is good (not including afternoon slump): 6 out of 10. Patient reported stress level: high but uses prayer to bring it down to 4 out of 10  Patient manages GERD by avoiding nuts at night and limited amounts of salad.  Diabetes Self-Management Education - 04/07/17 0820      Visit Information   Visit Type  First/Initial      Initial Visit   Diabetes Type  Type 2    Are you currently following a meal plan?  No    Are you taking your medications as prescribed?  Not on Medications    Date Diagnosed  11/2016 per referral      Health Coping   How would you rate your overall health?  Good      Psychosocial Assessment   Patient Belief/Attitude about Diabetes  Denial    How often do you need to have someone help you when you read instructions, pamphlets, or other written materials from your doctor or pharmacy?  1 - Never    What is the last grade level you completed in school?  2 yrs college      Complications   Last HgB A1C per patient/outside source  7.3 % per referral 11/30    How often do you check your blood sugar?  1-2 times/day     Fasting Blood glucose range (mg/dL)  70-129 99 - 140 (avg 122)    Number of hypoglycemic episodes per month  0    Number of hyperglycemic episodes per week  0    Have you had a dilated eye exam in the past 12 months?  Yes    Have you had a dental exam in the past 12 months?  Yes    Are you checking your feet?  Yes    How many days per week are you checking your feet?  -- not every week      Dietary Intake   Breakfast  oatmeal, chicken or egg whites, flaxseed, cinnamon (stopped raisins) 8:30 a    Snack (morning)  none    Lunch  strawberries, green beans, pasta, water    Snack (afternoon)  none OR low glycemic candy water or coffee    Dinner  vegetables, chicken, beans, [place], water    Snack (evening)  (craves salt) popcorn OR nuts OR tortilla chips    Beverage(s)  water, decaf coffee, crystal light, unsweet tea wtih stevia      Exercise   Exercise Type  ADL's 4 months recovery from knee surgery    How many days per week to  you exercise?  4    How many minutes per day do you exercise?  60    Total minutes per week of exercise  240      Patient Education   Previous Diabetes Education  No    Disease state   Definition of diabetes, type 1 and 2, and the diagnosis of diabetes    Nutrition management   Role of diet in the treatment of diabetes and the relationship between the three main macronutrients and blood glucose level;Carbohydrate counting;Food label reading, portion sizes and measuring food.    Physical activity and exercise   Role of exercise on diabetes management, blood pressure control and cardiac health.    Monitoring  Identified appropriate SMBG and/or A1C goals.    Psychosocial adjustment  Role of stress on diabetes      Individualized Goals (developed by patient)   Nutrition  General guidelines for healthy choices and portions discussed      Outcomes   Expected Outcomes  Demonstrated interest in learning. Expect positive outcomes    Future DMSE  PRN    Program Status   Completed     Individualized Plan for Diabetes Self-Management Training:   Learning Objective:  Patient will have a greater understanding of diabetes self-management. Patient education plan is to attend individual and/or group sessions per assessed needs and concerns.  Patient Instructions  Plan:  Aim for 2-3 Carb Choices per meal (30-45 grams)  Aim for 0-1 Carb Choices per snack if hungry (0-15 grams) Include protein with your meals and snacks Aim for 3 balanced meals per day, with not to heavy of a supper meal and not too late Consider reading food labels for Total Carbohydrate and Sat Fat Grams of foods Continue with your plan to increasing your physical activity Consider checking blood sugar at alternate times per day for a couple of weeks to see your patterns. Adequate and restful sleep is important for your health and controlling blood sugar  Expected Outcomes:  Demonstrated interest in learning. Expect positive outcomes  Education material provided: Living Well with Diabetes, A1C conversion sheet, My Plate, Snack sheet and Carbohydrate counting sheet, types of sugar, sleep hygiene  If problems or questions, patient to contact team via:  Phone and MyChart  Future DSME appointment: PRN

## 2017-04-07 NOTE — Patient Instructions (Signed)
Plan:  Aim for 2-3 Carb Choices per meal (30-45 grams)  Aim for 0-1 Carb Choices per snack if hungry (0-15 grams) Include protein with your meals and snacks Aim for 3 balanced meals per day, with not to heavy of a supper meal and not too late Consider reading food labels for Total Carbohydrate and Sat Fat Grams of foods Continue with your plan to increasing your physical activity Consider checking blood sugar at alternate times per day for a couple of weeks to see your patterns. Adequate and restful sleep is important for your health and controlling blood sugar

## 2017-05-10 DIAGNOSIS — J3489 Other specified disorders of nose and nasal sinuses: Secondary | ICD-10-CM | POA: Diagnosis not present

## 2017-06-16 DIAGNOSIS — E119 Type 2 diabetes mellitus without complications: Secondary | ICD-10-CM | POA: Diagnosis not present

## 2017-06-16 DIAGNOSIS — T783XXD Angioneurotic edema, subsequent encounter: Secondary | ICD-10-CM | POA: Diagnosis not present

## 2017-06-16 DIAGNOSIS — J3089 Other allergic rhinitis: Secondary | ICD-10-CM | POA: Diagnosis not present

## 2017-06-16 DIAGNOSIS — R030 Elevated blood-pressure reading, without diagnosis of hypertension: Secondary | ICD-10-CM | POA: Diagnosis not present

## 2017-06-16 DIAGNOSIS — L509 Urticaria, unspecified: Secondary | ICD-10-CM | POA: Diagnosis not present

## 2017-06-16 DIAGNOSIS — J301 Allergic rhinitis due to pollen: Secondary | ICD-10-CM | POA: Diagnosis not present

## 2017-06-19 DIAGNOSIS — E119 Type 2 diabetes mellitus without complications: Secondary | ICD-10-CM | POA: Diagnosis not present

## 2017-06-19 DIAGNOSIS — E559 Vitamin D deficiency, unspecified: Secondary | ICD-10-CM | POA: Diagnosis not present

## 2017-06-19 DIAGNOSIS — R42 Dizziness and giddiness: Secondary | ICD-10-CM | POA: Diagnosis not present

## 2017-06-19 DIAGNOSIS — R0689 Other abnormalities of breathing: Secondary | ICD-10-CM | POA: Diagnosis not present

## 2017-06-27 DIAGNOSIS — L602 Onychogryphosis: Secondary | ICD-10-CM | POA: Diagnosis not present

## 2017-06-27 DIAGNOSIS — L84 Corns and callosities: Secondary | ICD-10-CM | POA: Diagnosis not present

## 2017-06-27 DIAGNOSIS — E1351 Other specified diabetes mellitus with diabetic peripheral angiopathy without gangrene: Secondary | ICD-10-CM | POA: Diagnosis not present

## 2017-08-30 DIAGNOSIS — L821 Other seborrheic keratosis: Secondary | ICD-10-CM | POA: Diagnosis not present

## 2017-08-30 DIAGNOSIS — L239 Allergic contact dermatitis, unspecified cause: Secondary | ICD-10-CM | POA: Diagnosis not present

## 2017-09-06 DIAGNOSIS — Z1231 Encounter for screening mammogram for malignant neoplasm of breast: Secondary | ICD-10-CM | POA: Diagnosis not present

## 2017-09-06 DIAGNOSIS — M8589 Other specified disorders of bone density and structure, multiple sites: Secondary | ICD-10-CM | POA: Diagnosis not present

## 2017-09-12 ENCOUNTER — Encounter: Payer: Self-pay | Admitting: Gynecology

## 2017-09-15 ENCOUNTER — Encounter: Payer: Self-pay | Admitting: Gynecology

## 2017-09-21 DIAGNOSIS — H2513 Age-related nuclear cataract, bilateral: Secondary | ICD-10-CM | POA: Diagnosis not present

## 2017-09-21 DIAGNOSIS — H2511 Age-related nuclear cataract, right eye: Secondary | ICD-10-CM | POA: Diagnosis not present

## 2017-09-21 DIAGNOSIS — H5213 Myopia, bilateral: Secondary | ICD-10-CM | POA: Diagnosis not present

## 2017-09-21 DIAGNOSIS — H538 Other visual disturbances: Secondary | ICD-10-CM | POA: Diagnosis not present

## 2017-09-26 DIAGNOSIS — R030 Elevated blood-pressure reading, without diagnosis of hypertension: Secondary | ICD-10-CM | POA: Diagnosis not present

## 2017-09-26 DIAGNOSIS — R600 Localized edema: Secondary | ICD-10-CM | POA: Diagnosis not present

## 2017-09-26 DIAGNOSIS — Z9109 Other allergy status, other than to drugs and biological substances: Secondary | ICD-10-CM | POA: Diagnosis not present

## 2017-09-26 DIAGNOSIS — E559 Vitamin D deficiency, unspecified: Secondary | ICD-10-CM | POA: Diagnosis not present

## 2017-09-26 DIAGNOSIS — E119 Type 2 diabetes mellitus without complications: Secondary | ICD-10-CM | POA: Diagnosis not present

## 2017-10-04 DIAGNOSIS — L700 Acne vulgaris: Secondary | ICD-10-CM | POA: Diagnosis not present

## 2017-10-04 DIAGNOSIS — D2261 Melanocytic nevi of right upper limb, including shoulder: Secondary | ICD-10-CM | POA: Diagnosis not present

## 2017-10-04 DIAGNOSIS — L821 Other seborrheic keratosis: Secondary | ICD-10-CM | POA: Diagnosis not present

## 2017-10-04 DIAGNOSIS — L720 Epidermal cyst: Secondary | ICD-10-CM | POA: Diagnosis not present

## 2017-10-04 DIAGNOSIS — D2362 Other benign neoplasm of skin of left upper limb, including shoulder: Secondary | ICD-10-CM | POA: Diagnosis not present

## 2017-10-04 DIAGNOSIS — D2272 Melanocytic nevi of left lower limb, including hip: Secondary | ICD-10-CM | POA: Diagnosis not present

## 2017-10-04 DIAGNOSIS — D225 Melanocytic nevi of trunk: Secondary | ICD-10-CM | POA: Diagnosis not present

## 2017-10-04 DIAGNOSIS — L814 Other melanin hyperpigmentation: Secondary | ICD-10-CM | POA: Diagnosis not present

## 2017-10-04 DIAGNOSIS — I788 Other diseases of capillaries: Secondary | ICD-10-CM | POA: Diagnosis not present

## 2017-10-05 DIAGNOSIS — L602 Onychogryphosis: Secondary | ICD-10-CM | POA: Diagnosis not present

## 2017-10-05 DIAGNOSIS — L84 Corns and callosities: Secondary | ICD-10-CM | POA: Diagnosis not present

## 2017-10-05 DIAGNOSIS — E1351 Other specified diabetes mellitus with diabetic peripheral angiopathy without gangrene: Secondary | ICD-10-CM | POA: Diagnosis not present

## 2017-10-11 DIAGNOSIS — H2512 Age-related nuclear cataract, left eye: Secondary | ICD-10-CM | POA: Diagnosis not present

## 2017-10-11 DIAGNOSIS — H2511 Age-related nuclear cataract, right eye: Secondary | ICD-10-CM | POA: Diagnosis not present

## 2017-10-18 DIAGNOSIS — H2512 Age-related nuclear cataract, left eye: Secondary | ICD-10-CM | POA: Diagnosis not present

## 2017-11-01 ENCOUNTER — Ambulatory Visit (INDEPENDENT_AMBULATORY_CARE_PROVIDER_SITE_OTHER): Payer: Medicare HMO | Admitting: Women's Health

## 2017-11-01 ENCOUNTER — Encounter: Payer: Self-pay | Admitting: Women's Health

## 2017-11-01 VITALS — BP 134/78 | Ht 66.75 in | Wt 189.0 lb

## 2017-11-01 DIAGNOSIS — Z9189 Other specified personal risk factors, not elsewhere classified: Secondary | ICD-10-CM

## 2017-11-01 DIAGNOSIS — Z01419 Encounter for gynecological examination (general) (routine) without abnormal findings: Secondary | ICD-10-CM

## 2017-11-01 DIAGNOSIS — M8589 Other specified disorders of bone density and structure, multiple sites: Secondary | ICD-10-CM

## 2017-11-01 NOTE — Patient Instructions (Addendum)
Stool softner  colace  Health Maintenance for Postmenopausal Women Menopause is a normal process in which your reproductive ability comes to an end. This process happens gradually over a span of months to years, usually between the ages of 22 and 68. Menopause is complete when you have missed 12 consecutive menstrual periods. It is important to talk with your health care provider about some of the most common conditions that affect postmenopausal women, such as heart disease, cancer, and bone loss (osteoporosis). Adopting a healthy lifestyle and getting preventive care can help to promote your health and wellness. Those actions can also lower your chances of developing some of these common conditions. What should I know about menopause? During menopause, you may experience a number of symptoms, such as:  Moderate-to-severe hot flashes.  Night sweats.  Decrease in sex drive.  Mood swings.  Headaches.  Tiredness.  Irritability.  Memory problems.  Insomnia.  Choosing to treat or not to treat menopausal changes is an individual decision that you make with your health care provider. What should I know about hormone replacement therapy and supplements? Hormone therapy products are effective for treating symptoms that are associated with menopause, such as hot flashes and night sweats. Hormone replacement carries certain risks, especially as you become older. If you are thinking about using estrogen or estrogen with progestin treatments, discuss the benefits and risks with your health care provider. What should I know about heart disease and stroke? Heart disease, heart attack, and stroke become more likely as you age. This may be due, in part, to the hormonal changes that your body experiences during menopause. These can affect how your body processes dietary fats, triglycerides, and cholesterol. Heart attack and stroke are both medical emergencies. There are many things that you can do to  help prevent heart disease and stroke:  Have your blood pressure checked at least every 1-2 years. High blood pressure causes heart disease and increases the risk of stroke.  If you are 83-53 years old, ask your health care provider if you should take aspirin to prevent a heart attack or a stroke.  Do not use any tobacco products, including cigarettes, chewing tobacco, or electronic cigarettes. If you need help quitting, ask your health care provider.  It is important to eat a healthy diet and maintain a healthy weight. ? Be sure to include plenty of vegetables, fruits, low-fat dairy products, and lean protein. ? Avoid eating foods that are high in solid fats, added sugars, or salt (sodium).  Get regular exercise. This is one of the most important things that you can do for your health. ? Try to exercise for at least 150 minutes each week. The type of exercise that you do should increase your heart rate and make you sweat. This is known as moderate-intensity exercise. ? Try to do strengthening exercises at least twice each week. Do these in addition to the moderate-intensity exercise.  Know your numbers.Ask your health care provider to check your cholesterol and your blood glucose. Continue to have your blood tested as directed by your health care provider.  What should I know about cancer screening? There are several types of cancer. Take the following steps to reduce your risk and to catch any cancer development as early as possible. Breast Cancer  Practice breast self-awareness. ? This means understanding how your breasts normally appear and feel. ? It also means doing regular breast self-exams. Let your health care provider know about any changes, no matter how small.  If you are 40 or older, have a clinician do a breast exam (clinical breast exam or CBE) every year. Depending on your age, family history, and medical history, it may be recommended that you also have a yearly breast  X-ray (mammogram).  If you have a family history of breast cancer, talk with your health care provider about genetic screening.  If you are at high risk for breast cancer, talk with your health care provider about having an MRI and a mammogram every year.  Breast cancer (BRCA) gene test is recommended for women who have family members with BRCA-related cancers. Results of the assessment will determine the need for genetic counseling and BRCA1 and for BRCA2 testing. BRCA-related cancers include these types: ? Breast. This occurs in males or females. ? Ovarian. ? Tubal. This may also be called fallopian tube cancer. ? Cancer of the abdominal or pelvic lining (peritoneal cancer). ? Prostate. ? Pancreatic.  Cervical, Uterine, and Ovarian Cancer Your health care provider may recommend that you be screened regularly for cancer of the pelvic organs. These include your ovaries, uterus, and vagina. This screening involves a pelvic exam, which includes checking for microscopic changes to the surface of your cervix (Pap test).  For women ages 21-65, health care providers may recommend a pelvic exam and a Pap test every three years. For women ages 30-65, they may recommend the Pap test and pelvic exam, combined with testing for human papilloma virus (HPV), every five years. Some types of HPV increase your risk of cervical cancer. Testing for HPV may also be done on women of any age who have unclear Pap test results.  Other health care providers may not recommend any screening for nonpregnant women who are considered low risk for pelvic cancer and have no symptoms. Ask your health care provider if a screening pelvic exam is right for you.  If you have had past treatment for cervical cancer or a condition that could lead to cancer, you need Pap tests and screening for cancer for at least 20 years after your treatment. If Pap tests have been discontinued for you, your risk factors (such as having a new sexual  partner) need to be reassessed to determine if you should start having screenings again. Some women have medical problems that increase the chance of getting cervical cancer. In these cases, your health care provider may recommend that you have screening and Pap tests more often.  If you have a family history of uterine cancer or ovarian cancer, talk with your health care provider about genetic screening.  If you have vaginal bleeding after reaching menopause, tell your health care provider.  There are currently no reliable tests available to screen for ovarian cancer.  Lung Cancer Lung cancer screening is recommended for adults 55-80 years old who are at high risk for lung cancer because of a history of smoking. A yearly low-dose CT scan of the lungs is recommended if you:  Currently smoke.  Have a history of at least 30 pack-years of smoking and you currently smoke or have quit within the past 15 years. A pack-year is smoking an average of one pack of cigarettes per day for one year.  Yearly screening should:  Continue until it has been 15 years since you quit.  Stop if you develop a health problem that would prevent you from having lung cancer treatment.  Colorectal Cancer  This type of cancer can be detected and can often be prevented.  Routine colorectal cancer   screening usually begins at age 50 and continues through age 75.  If you have risk factors for colon cancer, your health care provider may recommend that you be screened at an earlier age.  If you have a family history of colorectal cancer, talk with your health care provider about genetic screening.  Your health care provider may also recommend using home test kits to check for hidden blood in your stool.  A small camera at the end of a tube can be used to examine your colon directly (sigmoidoscopy or colonoscopy). This is done to check for the earliest forms of colorectal cancer.  Direct examination of the colon  should be repeated every 5-10 years until age 75. However, if early forms of precancerous polyps or small growths are found or if you have a family history or genetic risk for colorectal cancer, you may need to be screened more often.  Skin Cancer  Check your skin from head to toe regularly.  Monitor any moles. Be sure to tell your health care provider: ? About any new moles or changes in moles, especially if there is a change in a mole's shape or color. ? If you have a mole that is larger than the size of a pencil eraser.  If any of your family members has a history of skin cancer, especially at a Vearl Allbaugh age, talk with your health care provider about genetic screening.  Always use sunscreen. Apply sunscreen liberally and repeatedly throughout the day.  Whenever you are outside, protect yourself by wearing long sleeves, pants, a wide-brimmed hat, and sunglasses.  What should I know about osteoporosis? Osteoporosis is a condition in which bone destruction happens more quickly than new bone creation. After menopause, you may be at an increased risk for osteoporosis. To help prevent osteoporosis or the bone fractures that can happen because of osteoporosis, the following is recommended:  If you are 19-50 years old, get at least 1,000 mg of calcium and at least 600 mg of vitamin D per day.  If you are older than age 50 but younger than age 70, get at least 1,200 mg of calcium and at least 600 mg of vitamin D per day.  If you are older than age 70, get at least 1,200 mg of calcium and at least 800 mg of vitamin D per day.  Smoking and excessive alcohol intake increase the risk of osteoporosis. Eat foods that are rich in calcium and vitamin D, and do weight-bearing exercises several times each week as directed by your health care provider. What should I know about how menopause affects my mental health? Depression may occur at any age, but it is more common as you become older. Common symptoms of  depression include:  Low or sad mood.  Changes in sleep patterns.  Changes in appetite or eating patterns.  Feeling an overall lack of motivation or enjoyment of activities that you previously enjoyed.  Frequent crying spells.  Talk with your health care provider if you think that you are experiencing depression. What should I know about immunizations? It is important that you get and maintain your immunizations. These include:  Tetanus, diphtheria, and pertussis (Tdap) booster vaccine.  Influenza every year before the flu season begins.  Pneumonia vaccine.  Shingles vaccine.  Your health care provider may also recommend other immunizations. This information is not intended to replace advice given to you by your health care provider. Make sure you discuss any questions you have with your health care   provider. Document Released: 05/27/2005 Document Revised: 10/23/2015 Document Reviewed: 01/06/2015 Elsevier Interactive Patient Education  2018 [employer].  Carbohydrate Counting for Diabetes Mellitus, Adult Carbohydrate counting is a method for keeping track of how many carbohydrates you eat. Eating carbohydrates naturally increases the amount of sugar (glucose) in the blood. Counting how many carbohydrates you eat helps keep your blood glucose within normal limits, which helps you manage your diabetes (diabetes mellitus). It is important to know how many carbohydrates you can safely have in each meal. This is different for every person. A diet and nutrition specialist (registered dietitian) can help you make a meal plan and calculate how many carbohydrates you should have at each meal and snack. Carbohydrates are found in the following foods:  Grains, such as breads and cereals.  Dried beans and soy products.  Starchy vegetables, such as potatoes, peas, and corn.  Fruit and fruit juices.  Milk and yogurt.  Sweets and snack foods, such as cake, cookies, candy, chips, and  soft drinks.  How do I count carbohydrates? There are two ways to count carbohydrates in food. You can use either of the methods or a combination of both. Reading "Nutrition Facts" on packaged food The "Nutrition Facts" list is included on the labels of almost all packaged foods and beverages in the [place] It includes:  The serving size.  Information about nutrients in each serving, including the grams (g) of carbohydrate per serving.  To use the "Nutrition Facts":  Decide how many servings you will have.  Multiply the number of servings by the number of carbohydrates per serving.  The resulting number is the total amount of carbohydrates that you will be having.  Learning standard serving sizes of other foods When you eat foods containing carbohydrates that are not packaged or do not include "Nutrition Facts" on the label, you need to measure the servings in order to count the amount of carbohydrates:  Measure the foods that you will eat with a food scale or measuring cup, if needed.  Decide how many standard-size servings you will eat.  Multiply the number of servings by 15. Most carbohydrate-rich foods have about 15 g of carbohydrates per serving. ? For example, if you eat 8 oz (170 g) of strawberries, you will have eaten 2 servings and 30 g of carbohydrates (2 servings x 15 g = 30 g).  For foods that have more than one food mixed, such as soups and casseroles, you must count the carbohydrates in each food that is included.  The following list contains standard serving sizes of common carbohydrate-rich foods. Each of these servings has about 15 g of carbohydrates:   hamburger bun or  English muffin.   oz (15 mL) syrup.   oz (14 g) jelly.  1 slice of bread.  1 six-inch tortilla.  3 oz (85 g) cooked rice or pasta.  4 oz (113 g) cooked dried beans.  4 oz (113 g) starchy vegetable, such as peas, corn, or potatoes.  4 oz (113 g) hot cereal.  4 oz (113 g) mashed  potatoes or  of a large baked potato.  4 oz (113 g) canned or frozen fruit.  4 oz (120 mL) fruit juice.  4-6 crackers.  6 chicken nuggets.  6 oz (170 g) unsweetened dry cereal.  6 oz (170 g) plain fat-free yogurt or yogurt sweetened with artificial sweeteners.  8 oz (240 mL) milk.  8 oz (170 g) fresh fruit or one small piece of fruit.  24 oz (680 g) popped popcorn.  Example of carbohydrate counting Sample meal  3 oz (85 g) chicken breast.  6 oz (170 g) brown rice.  4 oz (113 g) corn.  8 oz (240 mL) milk.  8 oz (170 g) strawberries with sugar-free whipped topping. Carbohydrate calculation 1. Identify the foods that contain carbohydrates: ? Rice. ? Corn. ? Milk. ? Strawberries. 2. Calculate how many servings you have of each food: ? 2 servings rice. ? 1 serving corn. ? 1 serving milk. ? 1 serving strawberries. 3. Multiply each number of servings by 15 g: ? 2 servings rice x 15 g = 30 g. ? 1 serving corn x 15 g = 15 g. ? 1 serving milk x 15 g = 15 g. ? 1 serving strawberries x 15 g = 15 g. 4. Add together all of the amounts to find the total grams of carbohydrates eaten: ? 30 g + 15 g + 15 g + 15 g = 75 g of carbohydrates total. This information is not intended to replace advice given to you by your health care provider. Make sure you discuss any questions you have with your health care provider. Document Released: 04/04/2005 Document Revised: 10/23/2015 Document Reviewed: 09/16/2015 Elsevier Interactive Patient Education  [employer].

## 2017-11-01 NOTE — Progress Notes (Signed)
Kelly Quinn 06/05/1945 [id number]    History:    Presents for breast and pelvic exam.  Normal Pap and mammogram history.  2018 benign colon polyp 5-year follow-up.  History of HSV 1 rare outbreaks.  08/2017 DEXA -1.9 at left femoral neck -2.2 at spine, DEXA showed improvement from 2017, FRAX 7.1% / 1.2%.  Primary care manages hypertension, diabetes.  Weight down 10 pounds, doing Zumba, line dancing and walking daily.  Vaccines current.  Past medical history, past surgical history, family history and social history were all reviewed and documented in the EPIC chart.  Retired from the [employer].  Sister breast cancer, brother diabetes, sister Alzheimer's deceased.  ROS:  A ROS was performed and pertinent positives and negatives are included.  Exam:  Vitals:   11/01/17 0814  BP: 134/78  Weight: 189 lb (85.7 kg)  Height: 5' 6.75" (1.695 m)   Body mass index is 29.82 kg/m.   General appearance:  Normal Thyroid:  Symmetrical, normal in size, without palpable masses or nodularity. Respiratory  Auscultation:  Clear without wheezing or rhonchi Cardiovascular  Auscultation:  Regular rate, without rubs, murmurs or gallops  Edema/varicosities:  Not grossly evident Abdominal  Soft,nontender, without masses, guarding or rebound.  Liver/spleen:  No organomegaly noted  Hernia:  None appreciated  Skin  Inspection:  Grossly normal   Breasts: Examined lying and sitting.     Right: Without masses, retractions, discharge or axillary adenopathy.     Left: Without masses, retractions, discharge or axillary adenopathy. Gentitourinary   Inguinal/mons:  Normal without inguinal adenopathy  External genitalia:  Normal  BUS/Urethra/Skene's glands:  Normal  Vagina:  Normal  Cervix:  Normal  Uterus: normal in size, shape and contour.  Midline and mobile  Adnexa/parametria:     Rt: Without masses or tenderness.   Lt: Without masses or tenderness.  Anus and perineum: Normal  Digital rectal exam: Normal  sphincter tone without palpated masses or tenderness  Assessment/Plan:  72 y.o. WBF G0 for breast and pelvic exam.  Postmenopausal on no HRT with no bleeding Hypertension, diabetes-primary care manages labs and meds Osteopenia without elevated FRAX  Plan: Having mild constipation, reviewed MiraLAX, Colace increasing fiber rich foods in diet.  Reviewed importance of continuing regular cardio and balance type exercise, low-carb diet.  Home safety, fall prevention discussed.  Congratulated on 10 pound weight loss.  SBE's, continue annual screening mammogram, calcium rich foods, vitamin D 2000 daily encouraged.    [place], 8:24 AM 11/01/2017

## 2017-11-04 DIAGNOSIS — H2000 Unspecified acute and subacute iridocyclitis: Secondary | ICD-10-CM | POA: Diagnosis not present

## 2017-12-11 DIAGNOSIS — J3089 Other allergic rhinitis: Secondary | ICD-10-CM | POA: Diagnosis not present

## 2017-12-11 DIAGNOSIS — J301 Allergic rhinitis due to pollen: Secondary | ICD-10-CM | POA: Diagnosis not present

## 2017-12-11 DIAGNOSIS — L509 Urticaria, unspecified: Secondary | ICD-10-CM | POA: Diagnosis not present

## 2017-12-11 DIAGNOSIS — T783XXD Angioneurotic edema, subsequent encounter: Secondary | ICD-10-CM | POA: Diagnosis not present

## 2017-12-22 DIAGNOSIS — L84 Corns and callosities: Secondary | ICD-10-CM | POA: Diagnosis not present

## 2017-12-22 DIAGNOSIS — E1351 Other specified diabetes mellitus with diabetic peripheral angiopathy without gangrene: Secondary | ICD-10-CM | POA: Diagnosis not present

## 2017-12-22 DIAGNOSIS — L602 Onychogryphosis: Secondary | ICD-10-CM | POA: Diagnosis not present

## 2017-12-29 DIAGNOSIS — L2089 Other atopic dermatitis: Secondary | ICD-10-CM | POA: Diagnosis not present

## 2017-12-29 DIAGNOSIS — L0889 Other specified local infections of the skin and subcutaneous tissue: Secondary | ICD-10-CM | POA: Diagnosis not present

## 2017-12-29 DIAGNOSIS — L738 Other specified follicular disorders: Secondary | ICD-10-CM | POA: Diagnosis not present

## 2018-01-01 ENCOUNTER — Other Ambulatory Visit: Payer: Self-pay | Admitting: Family Medicine

## 2018-01-01 ENCOUNTER — Ambulatory Visit
Admission: RE | Admit: 2018-01-01 | Discharge: 2018-01-01 | Disposition: A | Discharge status: Home / Self Care | Payer: Medicare HMO | Source: Ambulatory Visit | Attending: Family Medicine | Admitting: Family Medicine

## 2018-01-01 DIAGNOSIS — Q799 Congenital malformation of musculoskeletal system, unspecified: Secondary | ICD-10-CM

## 2018-01-01 DIAGNOSIS — R930 Abnormal findings on diagnostic imaging of skull and head, not elsewhere classified: Secondary | ICD-10-CM | POA: Diagnosis not present

## 2018-01-12 DIAGNOSIS — L738 Other specified follicular disorders: Secondary | ICD-10-CM | POA: Diagnosis not present

## 2018-01-25 DIAGNOSIS — J0100 Acute maxillary sinusitis, unspecified: Secondary | ICD-10-CM | POA: Diagnosis not present

## 2018-02-02 DIAGNOSIS — L739 Follicular disorder, unspecified: Secondary | ICD-10-CM | POA: Diagnosis not present

## 2018-02-02 DIAGNOSIS — L309 Dermatitis, unspecified: Secondary | ICD-10-CM | POA: Diagnosis not present

## 2018-02-09 DIAGNOSIS — Z4802 Encounter for removal of sutures: Secondary | ICD-10-CM | POA: Diagnosis not present

## 2018-02-09 DIAGNOSIS — L738 Other specified follicular disorders: Secondary | ICD-10-CM | POA: Diagnosis not present

## 2018-02-20 DIAGNOSIS — Z961 Presence of intraocular lens: Secondary | ICD-10-CM | POA: Diagnosis not present

## 2018-02-27 DIAGNOSIS — L602 Onychogryphosis: Secondary | ICD-10-CM | POA: Diagnosis not present

## 2018-02-27 DIAGNOSIS — E1351 Other specified diabetes mellitus with diabetic peripheral angiopathy without gangrene: Secondary | ICD-10-CM | POA: Diagnosis not present

## 2018-02-27 DIAGNOSIS — L84 Corns and callosities: Secondary | ICD-10-CM | POA: Diagnosis not present

## 2018-03-28 DIAGNOSIS — L738 Other specified follicular disorders: Secondary | ICD-10-CM | POA: Diagnosis not present

## 2018-03-28 DIAGNOSIS — L309 Dermatitis, unspecified: Secondary | ICD-10-CM | POA: Diagnosis not present

## 2018-03-28 DIAGNOSIS — L218 Other seborrheic dermatitis: Secondary | ICD-10-CM | POA: Diagnosis not present

## 2018-03-29 DIAGNOSIS — J309 Allergic rhinitis, unspecified: Secondary | ICD-10-CM | POA: Diagnosis not present

## 2018-03-29 DIAGNOSIS — Z23 Encounter for immunization: Secondary | ICD-10-CM | POA: Diagnosis not present

## 2018-03-29 DIAGNOSIS — R030 Elevated blood-pressure reading, without diagnosis of hypertension: Secondary | ICD-10-CM | POA: Diagnosis not present

## 2018-03-29 DIAGNOSIS — B009 Herpesviral infection, unspecified: Secondary | ICD-10-CM | POA: Diagnosis not present

## 2018-03-29 DIAGNOSIS — E119 Type 2 diabetes mellitus without complications: Secondary | ICD-10-CM | POA: Diagnosis not present

## 2018-03-29 DIAGNOSIS — R600 Localized edema: Secondary | ICD-10-CM | POA: Diagnosis not present

## 2018-05-07 DIAGNOSIS — R220 Localized swelling, mass and lump, head: Secondary | ICD-10-CM | POA: Diagnosis not present

## 2018-05-15 DIAGNOSIS — E1351 Other specified diabetes mellitus with diabetic peripheral angiopathy without gangrene: Secondary | ICD-10-CM | POA: Diagnosis not present

## 2018-05-15 DIAGNOSIS — L84 Corns and callosities: Secondary | ICD-10-CM | POA: Diagnosis not present

## 2018-05-15 DIAGNOSIS — L602 Onychogryphosis: Secondary | ICD-10-CM | POA: Diagnosis not present

## 2018-05-16 DIAGNOSIS — L820 Inflamed seborrheic keratosis: Secondary | ICD-10-CM | POA: Diagnosis not present

## 2018-05-16 DIAGNOSIS — L738 Other specified follicular disorders: Secondary | ICD-10-CM | POA: Diagnosis not present

## 2018-06-14 DIAGNOSIS — J301 Allergic rhinitis due to pollen: Secondary | ICD-10-CM | POA: Diagnosis not present

## 2018-06-14 DIAGNOSIS — T783XXD Angioneurotic edema, subsequent encounter: Secondary | ICD-10-CM | POA: Diagnosis not present

## 2018-06-14 DIAGNOSIS — J3089 Other allergic rhinitis: Secondary | ICD-10-CM | POA: Diagnosis not present

## 2018-06-14 DIAGNOSIS — L509 Urticaria, unspecified: Secondary | ICD-10-CM | POA: Diagnosis not present

## 2018-06-15 DIAGNOSIS — H6123 Impacted cerumen, bilateral: Secondary | ICD-10-CM | POA: Diagnosis not present

## 2018-08-21 DIAGNOSIS — L84 Corns and callosities: Secondary | ICD-10-CM | POA: Diagnosis not present

## 2018-08-21 DIAGNOSIS — G5762 Lesion of plantar nerve, left lower limb: Secondary | ICD-10-CM | POA: Diagnosis not present

## 2018-08-21 DIAGNOSIS — E1351 Other specified diabetes mellitus with diabetic peripheral angiopathy without gangrene: Secondary | ICD-10-CM | POA: Diagnosis not present

## 2018-08-21 DIAGNOSIS — L602 Onychogryphosis: Secondary | ICD-10-CM | POA: Diagnosis not present

## 2018-09-05 DIAGNOSIS — G5762 Lesion of plantar nerve, left lower limb: Secondary | ICD-10-CM | POA: Diagnosis not present

## 2018-09-27 ENCOUNTER — Encounter: Payer: Self-pay | Admitting: Gynecology

## 2018-09-27 DIAGNOSIS — Z1231 Encounter for screening mammogram for malignant neoplasm of breast: Secondary | ICD-10-CM | POA: Diagnosis not present

## 2018-09-28 DIAGNOSIS — R600 Localized edema: Secondary | ICD-10-CM | POA: Diagnosis not present

## 2018-09-28 DIAGNOSIS — E119 Type 2 diabetes mellitus without complications: Secondary | ICD-10-CM | POA: Diagnosis not present

## 2018-09-28 DIAGNOSIS — Z0000 Encounter for general adult medical examination without abnormal findings: Secondary | ICD-10-CM | POA: Diagnosis not present

## 2018-09-28 DIAGNOSIS — R030 Elevated blood-pressure reading, without diagnosis of hypertension: Secondary | ICD-10-CM | POA: Diagnosis not present

## 2018-09-28 DIAGNOSIS — M8580 Other specified disorders of bone density and structure, unspecified site: Secondary | ICD-10-CM | POA: Diagnosis not present

## 2018-09-28 DIAGNOSIS — J309 Allergic rhinitis, unspecified: Secondary | ICD-10-CM | POA: Diagnosis not present

## 2018-09-28 DIAGNOSIS — E559 Vitamin D deficiency, unspecified: Secondary | ICD-10-CM | POA: Diagnosis not present

## 2018-10-04 DIAGNOSIS — L089 Local infection of the skin and subcutaneous tissue, unspecified: Secondary | ICD-10-CM | POA: Diagnosis not present

## 2018-10-04 DIAGNOSIS — L650 Telogen effluvium: Secondary | ICD-10-CM | POA: Diagnosis not present

## 2018-10-31 DIAGNOSIS — E119 Type 2 diabetes mellitus without complications: Secondary | ICD-10-CM | POA: Diagnosis not present

## 2018-10-31 DIAGNOSIS — E559 Vitamin D deficiency, unspecified: Secondary | ICD-10-CM | POA: Diagnosis not present

## 2018-11-05 ENCOUNTER — Other Ambulatory Visit: Payer: Self-pay

## 2018-11-06 ENCOUNTER — Ambulatory Visit (INDEPENDENT_AMBULATORY_CARE_PROVIDER_SITE_OTHER): Payer: Medicare HMO | Admitting: Women's Health

## 2018-11-06 ENCOUNTER — Encounter: Payer: Self-pay | Admitting: Women's Health

## 2018-11-06 VITALS — BP 124/80 | Ht 66.0 in | Wt 184.0 lb

## 2018-11-06 DIAGNOSIS — D1801 Hemangioma of skin and subcutaneous tissue: Secondary | ICD-10-CM | POA: Diagnosis not present

## 2018-11-06 DIAGNOSIS — D2261 Melanocytic nevi of right upper limb, including shoulder: Secondary | ICD-10-CM | POA: Diagnosis not present

## 2018-11-06 DIAGNOSIS — L720 Epidermal cyst: Secondary | ICD-10-CM | POA: Diagnosis not present

## 2018-11-06 DIAGNOSIS — D225 Melanocytic nevi of trunk: Secondary | ICD-10-CM | POA: Diagnosis not present

## 2018-11-06 DIAGNOSIS — D2262 Melanocytic nevi of left upper limb, including shoulder: Secondary | ICD-10-CM | POA: Diagnosis not present

## 2018-11-06 DIAGNOSIS — Z01419 Encounter for gynecological examination (general) (routine) without abnormal findings: Secondary | ICD-10-CM

## 2018-11-06 DIAGNOSIS — D2271 Melanocytic nevi of right lower limb, including hip: Secondary | ICD-10-CM | POA: Diagnosis not present

## 2018-11-06 DIAGNOSIS — L738 Other specified follicular disorders: Secondary | ICD-10-CM | POA: Diagnosis not present

## 2018-11-06 DIAGNOSIS — L814 Other melanin hyperpigmentation: Secondary | ICD-10-CM | POA: Diagnosis not present

## 2018-11-06 DIAGNOSIS — L821 Other seborrheic keratosis: Secondary | ICD-10-CM | POA: Diagnosis not present

## 2018-11-06 NOTE — Progress Notes (Signed)
Kelly Quinn 12-30-1945 [id number]    History:    Presents for breast and pelvic exam.  Postmenopausal/no HRT/no bleeding.  Normal Pap and mammogram history.  2019 T score -2.2 at spine FRAX 7.1% / 1.2%.  Vaccines are current.  Primary care manages diabetes and hypertension.  2018 benign colon polyp 5-year follow-up.  Not sexually active  Past medical history, past surgical history, family history and social history were all reviewed and documented in the EPIC chart.  Retired [employer].  1 sister Alzheimer's, brother diabetes.  ROS:  A ROS was performed and pertinent positives and negatives are included.  Exam:  Vitals:   11/06/18 0805  BP: 124/80  Weight: 184 lb (83.5 kg)  Height: 5\' 6"  (1.676 m)   Body mass index is 29.7 kg/m.   General appearance:  Normal Thyroid:  Symmetrical, normal in size, without palpable masses or nodularity. Respiratory  Auscultation:  Clear without wheezing or rhonchi Cardiovascular  Auscultation:  Regular rate, without rubs, murmurs or gallops  Edema/varicosities:  Not grossly evident Abdominal  Soft,nontender, without masses, guarding or rebound.  Liver/spleen:  No organomegaly noted  Hernia:  None appreciated  Skin  Inspection:  Grossly normal   Breasts: Examined lying and sitting.     Right: Without masses, retractions, discharge or axillary adenopathy.     Left: Without masses, retractions, discharge or axillary adenopathy. Gentitourinary   Inguinal/mons:  Normal without inguinal adenopathy  External genitalia:  Normal  BUS/Urethra/Skene's glands:  Normal  Vagina:  Normal  Cervix:  Normal  Uterus:  normal in size, shape and contour.  Midline and mobile  Adnexa/parametria:     Rt: Without masses or tenderness.   Lt: Without masses or tenderness.  Anus and perineum: Normal  Digital rectal exam: Normal sphincter tone without palpated masses or tenderness  Assessment/Plan:  73 y.o. SBF G0 for breast and pelvic exam with no  complaints.  Postmenopausal on no HRT with no bleeding Osteopenia without elevated FRAX Hypertension, primary care manages labs and meds.  Plan: SBEs, continue annual screening mammogram, calcium rich foods, vitamin D 2000 daily encouraged.  Reviewed importance of weightbearing and balance type exercise encouraged yoga.  Mammogram due instructed to schedule.  Normal Pap history, new screening guidelines reviewed.    Huel Cote [facility], 8:31 AM 11/06/2018

## 2018-11-06 NOTE — Patient Instructions (Addendum)
Vit d3 2000 iu Exercise!!!  yoga A&D cream  Health Maintenance After Age 71 After age 74, you are at a higher risk for certain long-term diseases and infections as well as injuries from falls. Falls are a major cause of broken bones and head injuries in people who are older than age 73. Getting regular preventive care can help to keep you healthy and well. Preventive care includes getting regular testing and making lifestyle changes as recommended by your health care provider. Talk with your health care provider about:  Which screenings and tests you should have. A screening is a test that checks for a disease when you have no symptoms.  A diet and exercise plan that is right for you. What should I know about screenings and tests to prevent falls? Screening and testing are the best ways to find a health problem early. Early diagnosis and treatment give you the best chance of managing medical conditions that are common after age 26. Certain conditions and lifestyle choices may make you more likely to have a fall. Your health care provider may recommend:  Regular vision checks. Poor vision and conditions such as cataracts can make you more likely to have a fall. If you wear glasses, make sure to get your prescription updated if your vision changes.  Medicine review. Work with your health care provider to regularly review all of the medicines you are taking, including over-the-counter medicines. Ask your health care provider about any side effects that may make you more likely to have a fall. Tell your health care provider if any medicines that you take make you feel dizzy or sleepy.  Osteoporosis screening. Osteoporosis is a condition that causes the bones to get weaker. This can make the bones weak and cause them to break more easily.  Blood pressure screening. Blood pressure changes and medicines to control blood pressure can make you feel dizzy.  Strength and balance checks. Your health care  provider may recommend certain tests to check your strength and balance while standing, walking, or changing positions.  Foot health exam. Foot pain and numbness, as well as not wearing proper footwear, can make you more likely to have a fall.  Depression screening. You may be more likely to have a fall if you have a fear of falling, feel emotionally low, or feel unable to do activities that you used to do.  Alcohol use screening. Using too much alcohol can affect your balance and may make you more likely to have a fall. What actions can I take to lower my risk of falls? General instructions  Talk with your health care provider about your risks for falling. Tell your health care provider if: ? You fall. Be sure to tell your health care provider about all falls, even ones that seem minor. ? You feel dizzy, sleepy, or off-balance.  Take over-the-counter and prescription medicines only as told by your health care provider. These include any supplements.  Eat a healthy diet and maintain a healthy weight. A healthy diet includes low-fat dairy products, low-fat (lean) meats, and fiber from whole grains, beans, and lots of fruits and vegetables. Home safety  Remove any tripping hazards, such as rugs, cords, and clutter.  Install safety equipment such as grab bars in bathrooms and safety rails on stairs.  Keep rooms and walkways well-lit. Activity   Follow a regular exercise program to stay fit. This will help you maintain your balance. Ask your health care provider what types of exercise are  appropriate for you.  If you need a cane or walker, use it as recommended by your health care provider.  Wear supportive shoes that have nonskid soles. Lifestyle  Do not drink alcohol if your health care provider tells you not to drink.  If you drink alcohol, limit how much you have: ? 0-1 drink a day for women. ? 0-2 drinks a day for men.  Be aware of how much alcohol is in your drink. In the  [place] one drink equals one typical bottle of beer (12 oz), one-half glass of wine (5 oz), or one shot of hard liquor (1 oz).  Do not use any products that contain nicotine or tobacco, such as cigarettes and e-cigarettes. If you need help quitting, ask your health care provider. Summary  Having a healthy lifestyle and getting preventive care can help to protect your health and wellness after age 67.  Screening and testing are the best way to find a health problem early and help you avoid having a fall. Early diagnosis and treatment give you the best chance for managing medical conditions that are more common for people who are older than age 26.  Falls are a major cause of broken bones and head injuries in people who are older than age 4. Take precautions to prevent a fall at home.  Work with your health care provider to learn what changes you can make to improve your health and wellness and to prevent falls. This information is not intended to replace advice given to you by your health care provider. Make sure you discuss any questions you have with your health care provider. Document Released: 02/15/2017 Document Revised: 07/26/2018 Document Reviewed: 02/15/2017 Elsevier Patient Education  2020 [employer].

## 2018-11-09 DIAGNOSIS — J309 Allergic rhinitis, unspecified: Secondary | ICD-10-CM | POA: Diagnosis not present

## 2018-11-09 DIAGNOSIS — B009 Herpesviral infection, unspecified: Secondary | ICD-10-CM | POA: Diagnosis not present

## 2018-11-09 DIAGNOSIS — E119 Type 2 diabetes mellitus without complications: Secondary | ICD-10-CM | POA: Diagnosis not present

## 2018-11-20 DIAGNOSIS — L84 Corns and callosities: Secondary | ICD-10-CM | POA: Diagnosis not present

## 2018-11-20 DIAGNOSIS — L602 Onychogryphosis: Secondary | ICD-10-CM | POA: Diagnosis not present

## 2018-11-20 DIAGNOSIS — E1351 Other specified diabetes mellitus with diabetic peripheral angiopathy without gangrene: Secondary | ICD-10-CM | POA: Diagnosis not present

## 2018-12-04 ENCOUNTER — Other Ambulatory Visit: Payer: Self-pay

## 2018-12-04 ENCOUNTER — Ambulatory Visit (INDEPENDENT_AMBULATORY_CARE_PROVIDER_SITE_OTHER): Payer: Medicare HMO | Admitting: Women's Health

## 2018-12-04 DIAGNOSIS — N898 Other specified noninflammatory disorders of vagina: Secondary | ICD-10-CM

## 2018-12-04 MED ORDER — BETAMETHASONE VALERATE 0.1 % EX OINT: 1.0000 "application " | TOPICAL_OINTMENT | Freq: Two times a day (BID) | CUTANEOUS | 0 refills | Status: DC

## 2018-12-04 NOTE — Progress Notes (Signed)
Tele-visit per request has a problem that is persisted since annual exam last month with a painful external vaginal irritation.  Identified self per birthdate, patient is at home, I am at the office.  Reports has used over-the-counter A&D ointment externally twice daily with minimal relief of perineal irritation without itching.  Denies vaginal discharge, visible blisters, abdominal/back pain, any urinary symptoms, or fever.  States it is a small patch of skin near vaginal opening that has been there for a while but becoming more bothersome.  States uncomfortable to walk and walks most days of the week.  Has not been sexually active in years.  Postmenopausal on no HRT with no bleeding.  Had been doing numerous types of exercise prior to COVID that included Zumba, line dancing and daily walking.  Medical problems include hypertension and osteopenia without elevated FRAX.  Retired from [employer].  External perineal /vaginal irritation  Plan: Options reviewed, will try Valisone 0.1% ointment apply small amount twice daily for several days and instructed to call if area continues to be bothersome.  Aware of need to keep clean, dry, loose clothing and open to air when able.  Telephone visit 10 minutes

## 2018-12-04 NOTE — Patient Instructions (Signed)
Vaginitis Vaginitis is a condition in which the vaginal tissue swells and becomes red (inflamed). This condition is most often caused by a change in the normal balance of bacteria and yeast that live in the vagina. This change causes an overgrowth of certain bacteria or yeast, which causes the inflammation. There are different types of vaginitis, but the most common types are:  Bacterial vaginosis.  Yeast infection (candidiasis).  Trichomoniasis vaginitis. This is a sexually transmitted disease (STD).  Viral vaginitis.  Atrophic vaginitis.  Allergic vaginitis. What are the causes? The cause of this condition depends on the type of vaginitis. It can be caused by:  Bacteria (bacterial vaginosis).  Yeast, which is a fungus (yeast infection).  A parasite (trichomoniasis vaginitis).  A virus (viral vaginitis).  Low hormone levels (atrophic vaginitis). Low hormone levels can occur during pregnancy, breastfeeding, or after menopause.  Irritants, such as bubble baths, scented tampons, and feminine sprays (allergic vaginitis). Other factors can change the normal balance of the yeast and bacteria that live in the vagina. These include:  Antibiotic medicines.  Poor hygiene.  Diaphragms, vaginal sponges, spermicides, birth control pills, and intrauterine devices (IUD).  Sex.  Infection.  Uncontrolled diabetes.  A weakened defense (immune) system. What increases the risk? This condition is more likely to develop in women who:  Smoke.  Use vaginal douches, scented tampons, or scented sanitary pads.  Wear tight-fitting pants.  Wear thong underwear.  Use oral birth control pills or an IUD.  Have sex without a condom.  Have multiple sex partners.  Have an STD.  Frequently use the spermicide nonoxynol-9.  Eat lots of foods high in sugar.  Have uncontrolled diabetes.  Have low estrogen levels.  Have a weakened immune system from an immune disorder or medical  treatment.  Are pregnant or breastfeeding. What are the signs or symptoms? Symptoms vary depending on the cause of the vaginitis. Common symptoms include:  Abnormal vaginal discharge. ? The discharge is white, gray, or yellow with bacterial vaginosis. ? The discharge is thick, white, and cheesy with a yeast infection. ? The discharge is frothy and yellow or greenish with trichomoniasis.  A bad vaginal smell. The smell is fishy with bacterial vaginosis.  Vaginal itching, pain, or swelling.  Sex that is painful.  Pain or burning when urinating. Sometimes there are no symptoms. How is this diagnosed? This condition is diagnosed based on your symptoms and medical history. A physical exam, including a pelvic exam, will also be done. You may also have other tests, including:  Tests to determine the pH level (acidity or alkalinity) of your vagina.  A whiff test, to assess the odor that results when a sample of your vaginal discharge is mixed with a potassium hydroxide solution.  Tests of vaginal fluid. A sample will be examined under a microscope. How is this treated? Treatment varies depending on the type of vaginitis you have. Your treatment may include:  Antibiotic creams or pills to treat bacterial vaginosis and trichomoniasis.  Antifungal medicines, such as vaginal creams or suppositories, to treat a yeast infection.  Medicine to ease discomfort if you have viral vaginitis. Your sexual partner should also be treated.  Estrogen delivered in a cream, pill, suppository, or vaginal ring to treat atrophic vaginitis. If vaginal dryness occurs, lubricants and moisturizing creams may help. You may need to avoid scented soaps, sprays, or douches.  Stopping use of a product that is causing allergic vaginitis. Then using a vaginal cream to treat the symptoms. Follow   these instructions at home: Lifestyle  Keep your genital area clean and dry. Avoid soap, and only rinse the area with  water.  Do not douche or use tampons until your health care provider says it is okay to do so. Use sanitary pads, if needed.  Do not have sex until your health care provider approves. When you can return to sex, practice safe sex and use condoms.  Wipe from front to back. This avoids the spread of bacteria from the rectum to the vagina. General instructions  Take over-the-counter and prescription medicines only as told by your health care provider.  If you were prescribed an antibiotic medicine, take or use it as told by your health care provider. Do not stop taking or using the antibiotic even if you start to feel better.  Keep all follow-up visits as told by your health care provider. This is important. How is this prevented?  Use mild, non-scented products. Do not use things that can irritate the vagina, such as fabric softeners. Avoid the following products if they are scented: ? Feminine sprays. ? Detergents. ? Tampons. ? Feminine hygiene products. ? Soaps or bubble baths.  Let air reach your genital area. ? Wear cotton underwear to reduce moisture buildup. ? Avoid wearing underwear while you sleep. ? Avoid wearing tight pants and underwear or nylons without a cotton panel. ? Avoid wearing thong underwear.  Take off any wet clothing, such as bathing suits, as soon as possible.  Practice safe sex and use condoms. Contact a health care provider if:  You have abdominal pain.  You have a fever.  You have symptoms that last for more than 2-3 days. Get help right away if:  You have a fever and your symptoms suddenly get worse. Summary  Vaginitis is a condition in which the vaginal tissue becomes inflamed.This condition is most often caused by a change in the normal balance of bacteria and yeast that live in the vagina.  Treatment varies depending on the type of vaginitis you have.  Do not douche, use tampons , or have sex until your health care provider approves. When  you can return to sex, practice safe sex and use condoms. This information is not intended to replace advice given to you by your health care provider. Make sure you discuss any questions you have with your health care provider. Document Released: 01/30/2007 Document Revised: 03/17/2017 Document Reviewed: 05/10/2016 Elsevier Patient Education  2020 Else[employer] Inc.  

## 2019-01-16 IMAGING — CR DG SKULL 1-3V
3 series · 3 of 3 positions shown · non-contrast
Comparison: None.

CLINICAL DATA: Focal bony prominence in frontal region.

EXAM:
SKULL - 1-3 VIEW

[[person_name] pa]
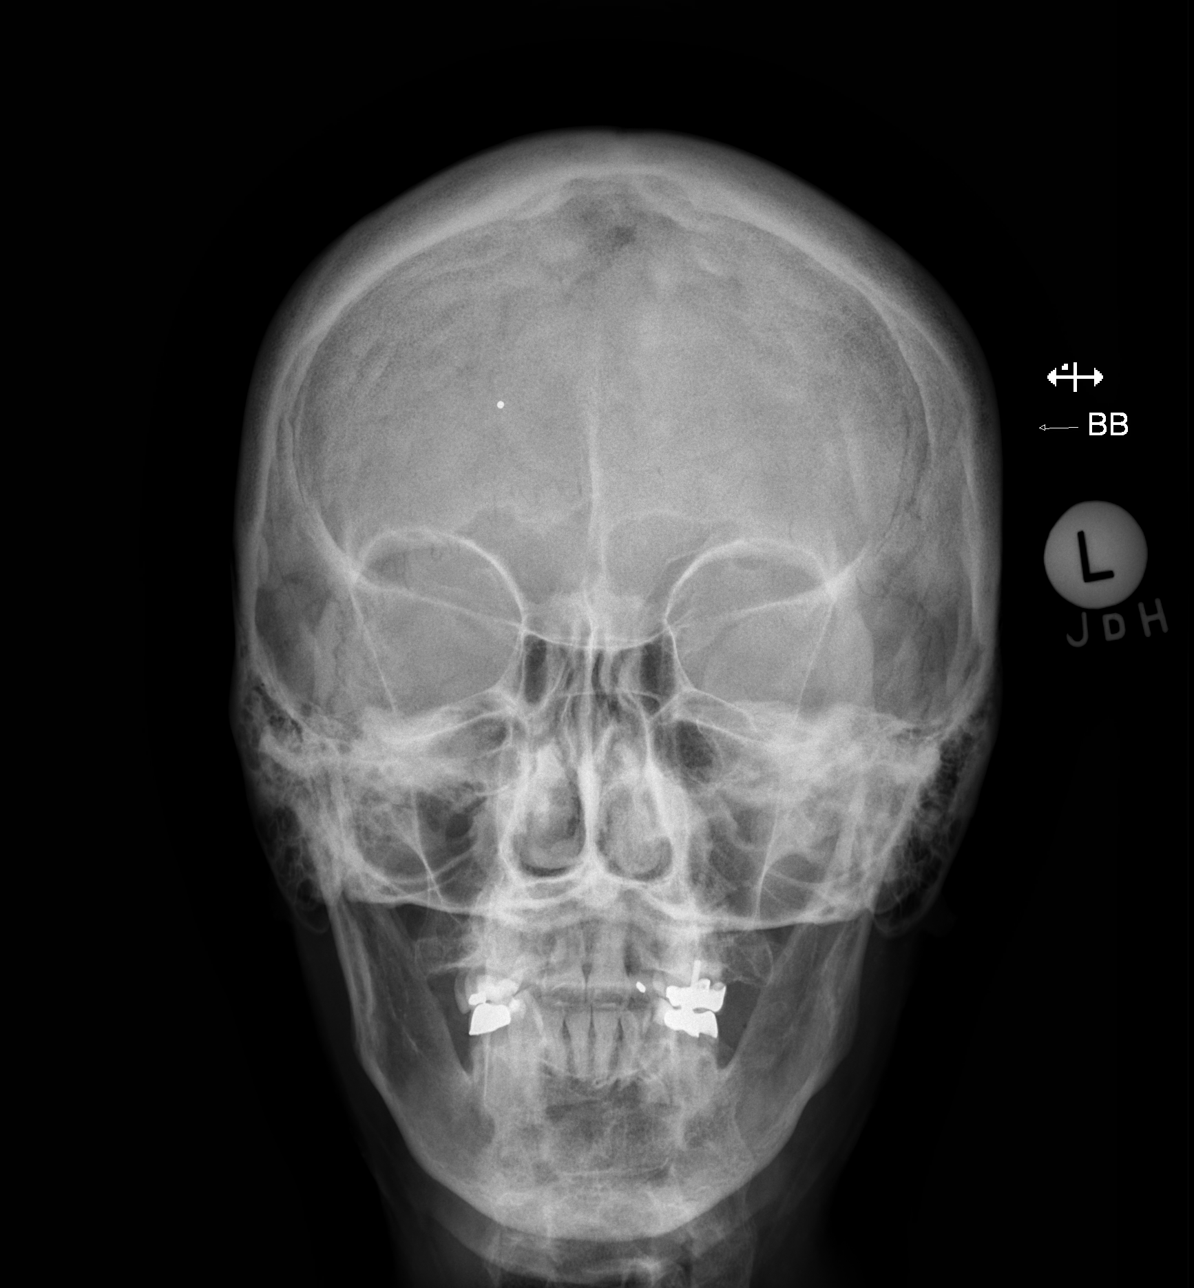

[w skull lat]
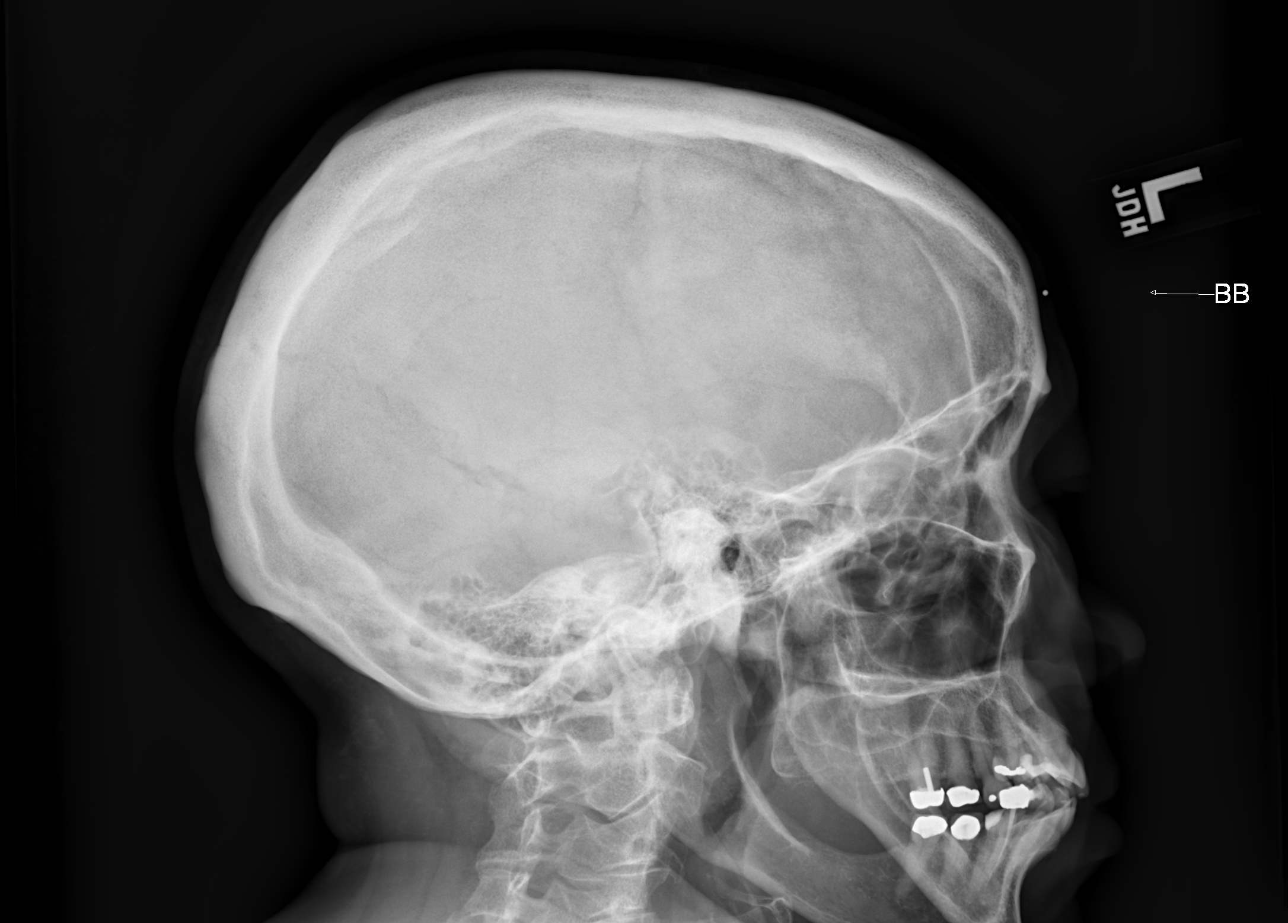

[[person_name]]
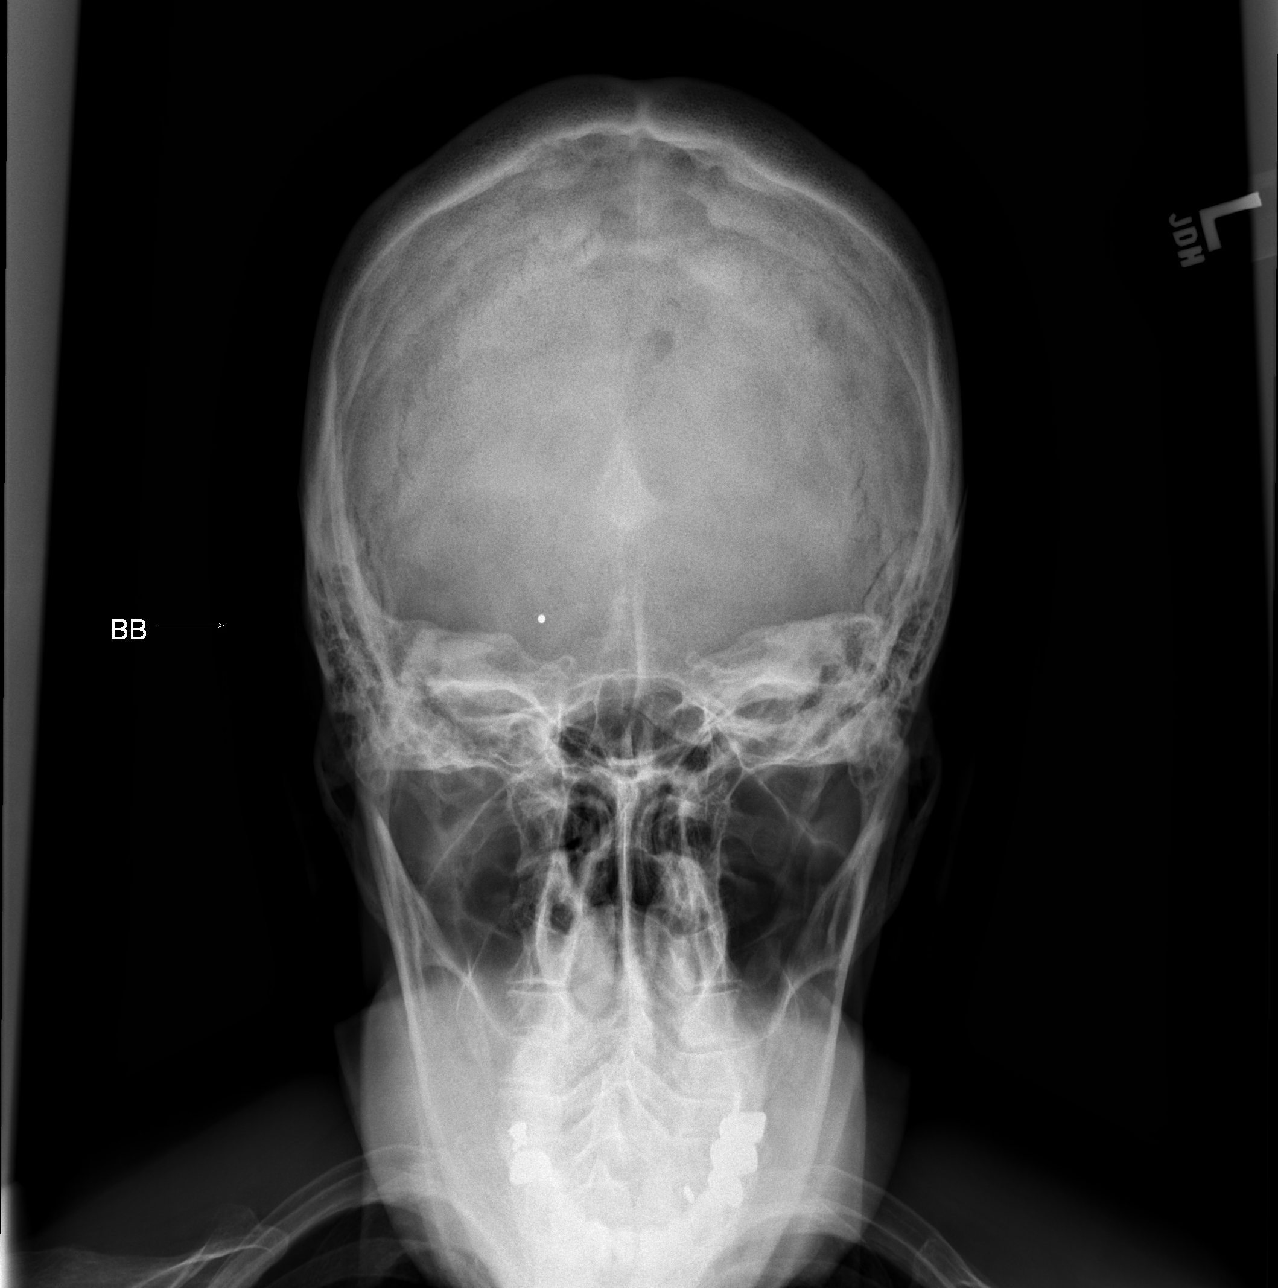

[3 of 3 positions shown; findings below may reference images not displayed]

FINDINGS: There is no evidence of skull fracture or other focal bone lesions.
No abnormality seen in area of cutaneous marker.
IMPRESSION: Negative.

## 2019-01-23 ENCOUNTER — Encounter: Payer: Self-pay | Admitting: Gynecology

## 2019-01-29 DIAGNOSIS — E1351 Other specified diabetes mellitus with diabetic peripheral angiopathy without gangrene: Secondary | ICD-10-CM | POA: Diagnosis not present

## 2019-01-29 DIAGNOSIS — L84 Corns and callosities: Secondary | ICD-10-CM | POA: Diagnosis not present

## 2019-01-29 DIAGNOSIS — L602 Onychogryphosis: Secondary | ICD-10-CM | POA: Diagnosis not present

## 2019-01-31 DIAGNOSIS — S8001XA Contusion of right knee, initial encounter: Secondary | ICD-10-CM | POA: Diagnosis not present

## 2019-01-31 DIAGNOSIS — S5001XA Contusion of right elbow, initial encounter: Secondary | ICD-10-CM | POA: Diagnosis not present

## 2019-02-11 ENCOUNTER — Telehealth: Payer: Self-pay | Admitting: *Deleted

## 2019-02-11 NOTE — Telephone Encounter (Signed)
Patient called requesting refill boric acid, needs new Rx other expired, last filled in 12/2015

## 2019-02-11 NOTE — Telephone Encounter (Signed)
Okay for refill or you can ask her to get it on [place] for $10 which may save her money.  If she is having trouble with discharge office visit best.

## 2019-02-12 NOTE — Telephone Encounter (Signed)
Patient said she will try the boric acid via [place]. If issues she will call back to get Rx from [facility].

## 2019-02-26 DIAGNOSIS — H524 Presbyopia: Secondary | ICD-10-CM | POA: Diagnosis not present

## 2019-02-28 DIAGNOSIS — M5412 Radiculopathy, cervical region: Secondary | ICD-10-CM | POA: Diagnosis not present

## 2019-04-02 DIAGNOSIS — M9901 Segmental and somatic dysfunction of cervical region: Secondary | ICD-10-CM | POA: Diagnosis not present

## 2019-04-02 DIAGNOSIS — M9902 Segmental and somatic dysfunction of thoracic region: Secondary | ICD-10-CM | POA: Diagnosis not present

## 2019-04-02 DIAGNOSIS — S29012A Strain of muscle and tendon of back wall of thorax, initial encounter: Secondary | ICD-10-CM | POA: Diagnosis not present

## 2019-04-02 DIAGNOSIS — M531 Cervicobrachial syndrome: Secondary | ICD-10-CM | POA: Diagnosis not present

## 2019-04-04 DIAGNOSIS — L219 Seborrheic dermatitis, unspecified: Secondary | ICD-10-CM | POA: Diagnosis not present

## 2019-04-04 DIAGNOSIS — L089 Local infection of the skin and subcutaneous tissue, unspecified: Secondary | ICD-10-CM | POA: Diagnosis not present

## 2019-04-04 DIAGNOSIS — L650 Telogen effluvium: Secondary | ICD-10-CM | POA: Diagnosis not present

## 2019-04-09 DIAGNOSIS — L84 Corns and callosities: Secondary | ICD-10-CM | POA: Diagnosis not present

## 2019-04-09 DIAGNOSIS — L602 Onychogryphosis: Secondary | ICD-10-CM | POA: Diagnosis not present

## 2019-04-09 DIAGNOSIS — E1351 Other specified diabetes mellitus with diabetic peripheral angiopathy without gangrene: Secondary | ICD-10-CM | POA: Diagnosis not present

## 2019-04-15 DIAGNOSIS — M9901 Segmental and somatic dysfunction of cervical region: Secondary | ICD-10-CM | POA: Diagnosis not present

## 2019-04-15 DIAGNOSIS — M531 Cervicobrachial syndrome: Secondary | ICD-10-CM | POA: Diagnosis not present

## 2019-04-15 DIAGNOSIS — S29012A Strain of muscle and tendon of back wall of thorax, initial encounter: Secondary | ICD-10-CM | POA: Diagnosis not present

## 2019-04-15 DIAGNOSIS — M9902 Segmental and somatic dysfunction of thoracic region: Secondary | ICD-10-CM | POA: Diagnosis not present

## 2019-04-23 DIAGNOSIS — M9902 Segmental and somatic dysfunction of thoracic region: Secondary | ICD-10-CM | POA: Diagnosis not present

## 2019-04-23 DIAGNOSIS — M9901 Segmental and somatic dysfunction of cervical region: Secondary | ICD-10-CM | POA: Diagnosis not present

## 2019-04-23 DIAGNOSIS — S29012A Strain of muscle and tendon of back wall of thorax, initial encounter: Secondary | ICD-10-CM | POA: Diagnosis not present

## 2019-04-23 DIAGNOSIS — M531 Cervicobrachial syndrome: Secondary | ICD-10-CM | POA: Diagnosis not present

## 2019-04-25 DIAGNOSIS — M9902 Segmental and somatic dysfunction of thoracic region: Secondary | ICD-10-CM | POA: Diagnosis not present

## 2019-04-25 DIAGNOSIS — M9901 Segmental and somatic dysfunction of cervical region: Secondary | ICD-10-CM | POA: Diagnosis not present

## 2019-04-25 DIAGNOSIS — S29012A Strain of muscle and tendon of back wall of thorax, initial encounter: Secondary | ICD-10-CM | POA: Diagnosis not present

## 2019-04-25 DIAGNOSIS — M531 Cervicobrachial syndrome: Secondary | ICD-10-CM | POA: Diagnosis not present

## 2019-04-30 DIAGNOSIS — M9902 Segmental and somatic dysfunction of thoracic region: Secondary | ICD-10-CM | POA: Diagnosis not present

## 2019-04-30 DIAGNOSIS — M9901 Segmental and somatic dysfunction of cervical region: Secondary | ICD-10-CM | POA: Diagnosis not present

## 2019-04-30 DIAGNOSIS — S29012A Strain of muscle and tendon of back wall of thorax, initial encounter: Secondary | ICD-10-CM | POA: Diagnosis not present

## 2019-04-30 DIAGNOSIS — M531 Cervicobrachial syndrome: Secondary | ICD-10-CM | POA: Diagnosis not present

## 2019-05-02 DIAGNOSIS — M9901 Segmental and somatic dysfunction of cervical region: Secondary | ICD-10-CM | POA: Diagnosis not present

## 2019-05-02 DIAGNOSIS — M9902 Segmental and somatic dysfunction of thoracic region: Secondary | ICD-10-CM | POA: Diagnosis not present

## 2019-05-02 DIAGNOSIS — S29012A Strain of muscle and tendon of back wall of thorax, initial encounter: Secondary | ICD-10-CM | POA: Diagnosis not present

## 2019-05-02 DIAGNOSIS — M531 Cervicobrachial syndrome: Secondary | ICD-10-CM | POA: Diagnosis not present

## 2019-05-07 DIAGNOSIS — M531 Cervicobrachial syndrome: Secondary | ICD-10-CM | POA: Diagnosis not present

## 2019-05-07 DIAGNOSIS — M9902 Segmental and somatic dysfunction of thoracic region: Secondary | ICD-10-CM | POA: Diagnosis not present

## 2019-05-07 DIAGNOSIS — M9901 Segmental and somatic dysfunction of cervical region: Secondary | ICD-10-CM | POA: Diagnosis not present

## 2019-05-07 DIAGNOSIS — S29012A Strain of muscle and tendon of back wall of thorax, initial encounter: Secondary | ICD-10-CM | POA: Diagnosis not present

## 2019-05-09 DIAGNOSIS — M531 Cervicobrachial syndrome: Secondary | ICD-10-CM | POA: Diagnosis not present

## 2019-05-09 DIAGNOSIS — M9901 Segmental and somatic dysfunction of cervical region: Secondary | ICD-10-CM | POA: Diagnosis not present

## 2019-05-09 DIAGNOSIS — M9902 Segmental and somatic dysfunction of thoracic region: Secondary | ICD-10-CM | POA: Diagnosis not present

## 2019-05-09 DIAGNOSIS — S29012A Strain of muscle and tendon of back wall of thorax, initial encounter: Secondary | ICD-10-CM | POA: Diagnosis not present

## 2019-05-10 DIAGNOSIS — S46011A Strain of muscle(s) and tendon(s) of the rotator cuff of right shoulder, initial encounter: Secondary | ICD-10-CM | POA: Diagnosis not present

## 2019-05-10 DIAGNOSIS — M7541 Impingement syndrome of right shoulder: Secondary | ICD-10-CM | POA: Diagnosis not present

## 2019-05-13 DIAGNOSIS — R600 Localized edema: Secondary | ICD-10-CM | POA: Diagnosis not present

## 2019-05-13 DIAGNOSIS — E119 Type 2 diabetes mellitus without complications: Secondary | ICD-10-CM | POA: Diagnosis not present

## 2019-05-13 DIAGNOSIS — S46011A Strain of muscle(s) and tendon(s) of the rotator cuff of right shoulder, initial encounter: Secondary | ICD-10-CM | POA: Diagnosis not present

## 2019-05-13 DIAGNOSIS — M7541 Impingement syndrome of right shoulder: Secondary | ICD-10-CM | POA: Diagnosis not present

## 2019-05-13 DIAGNOSIS — J309 Allergic rhinitis, unspecified: Secondary | ICD-10-CM | POA: Diagnosis not present

## 2019-05-13 DIAGNOSIS — R030 Elevated blood-pressure reading, without diagnosis of hypertension: Secondary | ICD-10-CM | POA: Diagnosis not present

## 2019-05-14 DIAGNOSIS — S29012A Strain of muscle and tendon of back wall of thorax, initial encounter: Secondary | ICD-10-CM | POA: Diagnosis not present

## 2019-05-14 DIAGNOSIS — M9902 Segmental and somatic dysfunction of thoracic region: Secondary | ICD-10-CM | POA: Diagnosis not present

## 2019-05-14 DIAGNOSIS — S138XXA Sprain of joints and ligaments of other parts of neck, initial encounter: Secondary | ICD-10-CM | POA: Diagnosis not present

## 2019-05-14 DIAGNOSIS — M9901 Segmental and somatic dysfunction of cervical region: Secondary | ICD-10-CM | POA: Diagnosis not present

## 2019-05-16 DIAGNOSIS — S46011A Strain of muscle(s) and tendon(s) of the rotator cuff of right shoulder, initial encounter: Secondary | ICD-10-CM | POA: Diagnosis not present

## 2019-05-16 DIAGNOSIS — M7541 Impingement syndrome of right shoulder: Secondary | ICD-10-CM | POA: Diagnosis not present

## 2019-05-20 DIAGNOSIS — M7541 Impingement syndrome of right shoulder: Secondary | ICD-10-CM | POA: Diagnosis not present

## 2019-05-20 DIAGNOSIS — S46011A Strain of muscle(s) and tendon(s) of the rotator cuff of right shoulder, initial encounter: Secondary | ICD-10-CM | POA: Diagnosis not present

## 2019-05-21 DIAGNOSIS — M9901 Segmental and somatic dysfunction of cervical region: Secondary | ICD-10-CM | POA: Diagnosis not present

## 2019-05-21 DIAGNOSIS — M9902 Segmental and somatic dysfunction of thoracic region: Secondary | ICD-10-CM | POA: Diagnosis not present

## 2019-05-21 DIAGNOSIS — S29012A Strain of muscle and tendon of back wall of thorax, initial encounter: Secondary | ICD-10-CM | POA: Diagnosis not present

## 2019-05-21 DIAGNOSIS — S138XXA Sprain of joints and ligaments of other parts of neck, initial encounter: Secondary | ICD-10-CM | POA: Diagnosis not present

## 2019-05-23 DIAGNOSIS — S46011A Strain of muscle(s) and tendon(s) of the rotator cuff of right shoulder, initial encounter: Secondary | ICD-10-CM | POA: Diagnosis not present

## 2019-05-23 DIAGNOSIS — M7541 Impingement syndrome of right shoulder: Secondary | ICD-10-CM | POA: Diagnosis not present

## 2019-05-24 DIAGNOSIS — E119 Type 2 diabetes mellitus without complications: Secondary | ICD-10-CM | POA: Diagnosis not present

## 2019-05-28 DIAGNOSIS — M9901 Segmental and somatic dysfunction of cervical region: Secondary | ICD-10-CM | POA: Diagnosis not present

## 2019-05-28 DIAGNOSIS — S29012A Strain of muscle and tendon of back wall of thorax, initial encounter: Secondary | ICD-10-CM | POA: Diagnosis not present

## 2019-05-28 DIAGNOSIS — M9902 Segmental and somatic dysfunction of thoracic region: Secondary | ICD-10-CM | POA: Diagnosis not present

## 2019-05-28 DIAGNOSIS — S46011A Strain of muscle(s) and tendon(s) of the rotator cuff of right shoulder, initial encounter: Secondary | ICD-10-CM | POA: Diagnosis not present

## 2019-05-28 DIAGNOSIS — S138XXA Sprain of joints and ligaments of other parts of neck, initial encounter: Secondary | ICD-10-CM | POA: Diagnosis not present

## 2019-05-28 DIAGNOSIS — M7541 Impingement syndrome of right shoulder: Secondary | ICD-10-CM | POA: Diagnosis not present

## 2019-05-30 DIAGNOSIS — M7541 Impingement syndrome of right shoulder: Secondary | ICD-10-CM | POA: Diagnosis not present

## 2019-05-30 DIAGNOSIS — S46011A Strain of muscle(s) and tendon(s) of the rotator cuff of right shoulder, initial encounter: Secondary | ICD-10-CM | POA: Diagnosis not present

## 2019-06-03 DIAGNOSIS — M7541 Impingement syndrome of right shoulder: Secondary | ICD-10-CM | POA: Diagnosis not present

## 2019-06-03 DIAGNOSIS — S46011A Strain of muscle(s) and tendon(s) of the rotator cuff of right shoulder, initial encounter: Secondary | ICD-10-CM | POA: Diagnosis not present

## 2019-06-04 DIAGNOSIS — S29012A Strain of muscle and tendon of back wall of thorax, initial encounter: Secondary | ICD-10-CM | POA: Diagnosis not present

## 2019-06-04 DIAGNOSIS — S138XXA Sprain of joints and ligaments of other parts of neck, initial encounter: Secondary | ICD-10-CM | POA: Diagnosis not present

## 2019-06-04 DIAGNOSIS — M9902 Segmental and somatic dysfunction of thoracic region: Secondary | ICD-10-CM | POA: Diagnosis not present

## 2019-06-04 DIAGNOSIS — M9901 Segmental and somatic dysfunction of cervical region: Secondary | ICD-10-CM | POA: Diagnosis not present

## 2019-06-10 DIAGNOSIS — S46011A Strain of muscle(s) and tendon(s) of the rotator cuff of right shoulder, initial encounter: Secondary | ICD-10-CM | POA: Diagnosis not present

## 2019-06-10 DIAGNOSIS — M7541 Impingement syndrome of right shoulder: Secondary | ICD-10-CM | POA: Diagnosis not present

## 2019-06-11 DIAGNOSIS — E7800 Pure hypercholesterolemia, unspecified: Secondary | ICD-10-CM | POA: Diagnosis not present

## 2019-06-11 DIAGNOSIS — M8580 Other specified disorders of bone density and structure, unspecified site: Secondary | ICD-10-CM | POA: Diagnosis not present

## 2019-06-11 DIAGNOSIS — I10 Essential (primary) hypertension: Secondary | ICD-10-CM | POA: Diagnosis not present

## 2019-06-11 DIAGNOSIS — Z7984 Long term (current) use of oral hypoglycemic drugs: Secondary | ICD-10-CM | POA: Diagnosis not present

## 2019-06-11 DIAGNOSIS — E119 Type 2 diabetes mellitus without complications: Secondary | ICD-10-CM | POA: Diagnosis not present

## 2019-06-12 DIAGNOSIS — S29012A Strain of muscle and tendon of back wall of thorax, initial encounter: Secondary | ICD-10-CM | POA: Diagnosis not present

## 2019-06-12 DIAGNOSIS — M9901 Segmental and somatic dysfunction of cervical region: Secondary | ICD-10-CM | POA: Diagnosis not present

## 2019-06-12 DIAGNOSIS — S138XXA Sprain of joints and ligaments of other parts of neck, initial encounter: Secondary | ICD-10-CM | POA: Diagnosis not present

## 2019-06-12 DIAGNOSIS — M9902 Segmental and somatic dysfunction of thoracic region: Secondary | ICD-10-CM | POA: Diagnosis not present

## 2019-06-13 DIAGNOSIS — T783XXD Angioneurotic edema, subsequent encounter: Secondary | ICD-10-CM | POA: Diagnosis not present

## 2019-06-13 DIAGNOSIS — J3089 Other allergic rhinitis: Secondary | ICD-10-CM | POA: Diagnosis not present

## 2019-06-13 DIAGNOSIS — L509 Urticaria, unspecified: Secondary | ICD-10-CM | POA: Diagnosis not present

## 2019-06-13 DIAGNOSIS — J301 Allergic rhinitis due to pollen: Secondary | ICD-10-CM | POA: Diagnosis not present

## 2019-06-13 DIAGNOSIS — M7541 Impingement syndrome of right shoulder: Secondary | ICD-10-CM | POA: Diagnosis not present

## 2019-06-13 DIAGNOSIS — S46011A Strain of muscle(s) and tendon(s) of the rotator cuff of right shoulder, initial encounter: Secondary | ICD-10-CM | POA: Diagnosis not present

## 2019-06-18 DIAGNOSIS — M7541 Impingement syndrome of right shoulder: Secondary | ICD-10-CM | POA: Diagnosis not present

## 2019-06-18 DIAGNOSIS — S46011A Strain of muscle(s) and tendon(s) of the rotator cuff of right shoulder, initial encounter: Secondary | ICD-10-CM | POA: Diagnosis not present

## 2019-06-20 DIAGNOSIS — S46011A Strain of muscle(s) and tendon(s) of the rotator cuff of right shoulder, initial encounter: Secondary | ICD-10-CM | POA: Diagnosis not present

## 2019-06-20 DIAGNOSIS — L84 Corns and callosities: Secondary | ICD-10-CM | POA: Diagnosis not present

## 2019-06-20 DIAGNOSIS — L602 Onychogryphosis: Secondary | ICD-10-CM | POA: Diagnosis not present

## 2019-06-20 DIAGNOSIS — E1351 Other specified diabetes mellitus with diabetic peripheral angiopathy without gangrene: Secondary | ICD-10-CM | POA: Diagnosis not present

## 2019-06-20 DIAGNOSIS — M7541 Impingement syndrome of right shoulder: Secondary | ICD-10-CM | POA: Diagnosis not present

## 2019-06-20 DIAGNOSIS — I10 Essential (primary) hypertension: Secondary | ICD-10-CM | POA: Diagnosis not present

## 2019-06-25 DIAGNOSIS — M7541 Impingement syndrome of right shoulder: Secondary | ICD-10-CM | POA: Diagnosis not present

## 2019-06-25 DIAGNOSIS — E7800 Pure hypercholesterolemia, unspecified: Secondary | ICD-10-CM | POA: Diagnosis not present

## 2019-06-25 DIAGNOSIS — M8580 Other specified disorders of bone density and structure, unspecified site: Secondary | ICD-10-CM | POA: Diagnosis not present

## 2019-06-25 DIAGNOSIS — E119 Type 2 diabetes mellitus without complications: Secondary | ICD-10-CM | POA: Diagnosis not present

## 2019-06-25 DIAGNOSIS — S46011A Strain of muscle(s) and tendon(s) of the rotator cuff of right shoulder, initial encounter: Secondary | ICD-10-CM | POA: Diagnosis not present

## 2019-06-25 DIAGNOSIS — I10 Essential (primary) hypertension: Secondary | ICD-10-CM | POA: Diagnosis not present

## 2019-06-27 DIAGNOSIS — S46011A Strain of muscle(s) and tendon(s) of the rotator cuff of right shoulder, initial encounter: Secondary | ICD-10-CM | POA: Diagnosis not present

## 2019-06-27 DIAGNOSIS — M7541 Impingement syndrome of right shoulder: Secondary | ICD-10-CM | POA: Diagnosis not present

## 2019-07-04 DIAGNOSIS — S46011A Strain of muscle(s) and tendon(s) of the rotator cuff of right shoulder, initial encounter: Secondary | ICD-10-CM | POA: Diagnosis not present

## 2019-07-04 DIAGNOSIS — M7541 Impingement syndrome of right shoulder: Secondary | ICD-10-CM | POA: Diagnosis not present

## 2019-07-09 DIAGNOSIS — S46011A Strain of muscle(s) and tendon(s) of the rotator cuff of right shoulder, initial encounter: Secondary | ICD-10-CM | POA: Diagnosis not present

## 2019-07-09 DIAGNOSIS — M7541 Impingement syndrome of right shoulder: Secondary | ICD-10-CM | POA: Diagnosis not present

## 2019-07-10 DIAGNOSIS — M9901 Segmental and somatic dysfunction of cervical region: Secondary | ICD-10-CM | POA: Diagnosis not present

## 2019-07-10 DIAGNOSIS — S138XXA Sprain of joints and ligaments of other parts of neck, initial encounter: Secondary | ICD-10-CM | POA: Diagnosis not present

## 2019-07-10 DIAGNOSIS — S29012A Strain of muscle and tendon of back wall of thorax, initial encounter: Secondary | ICD-10-CM | POA: Diagnosis not present

## 2019-07-10 DIAGNOSIS — M9902 Segmental and somatic dysfunction of thoracic region: Secondary | ICD-10-CM | POA: Diagnosis not present

## 2019-07-11 DIAGNOSIS — M7541 Impingement syndrome of right shoulder: Secondary | ICD-10-CM | POA: Diagnosis not present

## 2019-07-11 DIAGNOSIS — S46011A Strain of muscle(s) and tendon(s) of the rotator cuff of right shoulder, initial encounter: Secondary | ICD-10-CM | POA: Diagnosis not present

## 2019-07-16 DIAGNOSIS — S46011A Strain of muscle(s) and tendon(s) of the rotator cuff of right shoulder, initial encounter: Secondary | ICD-10-CM | POA: Diagnosis not present

## 2019-07-16 DIAGNOSIS — M7541 Impingement syndrome of right shoulder: Secondary | ICD-10-CM | POA: Diagnosis not present

## 2019-07-17 DIAGNOSIS — Z7984 Long term (current) use of oral hypoglycemic drugs: Secondary | ICD-10-CM | POA: Diagnosis not present

## 2019-07-17 DIAGNOSIS — E119 Type 2 diabetes mellitus without complications: Secondary | ICD-10-CM | POA: Diagnosis not present

## 2019-07-18 DIAGNOSIS — S46011A Strain of muscle(s) and tendon(s) of the rotator cuff of right shoulder, initial encounter: Secondary | ICD-10-CM | POA: Diagnosis not present

## 2019-07-18 DIAGNOSIS — M7541 Impingement syndrome of right shoulder: Secondary | ICD-10-CM | POA: Diagnosis not present

## 2019-07-23 DIAGNOSIS — S46011A Strain of muscle(s) and tendon(s) of the rotator cuff of right shoulder, initial encounter: Secondary | ICD-10-CM | POA: Diagnosis not present

## 2019-07-23 DIAGNOSIS — M7541 Impingement syndrome of right shoulder: Secondary | ICD-10-CM | POA: Diagnosis not present

## 2019-07-25 DIAGNOSIS — M7541 Impingement syndrome of right shoulder: Secondary | ICD-10-CM | POA: Diagnosis not present

## 2019-07-25 DIAGNOSIS — S46011A Strain of muscle(s) and tendon(s) of the rotator cuff of right shoulder, initial encounter: Secondary | ICD-10-CM | POA: Diagnosis not present

## 2019-07-29 DIAGNOSIS — M7541 Impingement syndrome of right shoulder: Secondary | ICD-10-CM | POA: Diagnosis not present

## 2019-07-29 DIAGNOSIS — S46011A Strain of muscle(s) and tendon(s) of the rotator cuff of right shoulder, initial encounter: Secondary | ICD-10-CM | POA: Diagnosis not present

## 2019-07-31 DIAGNOSIS — M7541 Impingement syndrome of right shoulder: Secondary | ICD-10-CM | POA: Diagnosis not present

## 2019-07-31 DIAGNOSIS — S46011A Strain of muscle(s) and tendon(s) of the rotator cuff of right shoulder, initial encounter: Secondary | ICD-10-CM | POA: Diagnosis not present

## 2019-08-06 DIAGNOSIS — S46011A Strain of muscle(s) and tendon(s) of the rotator cuff of right shoulder, initial encounter: Secondary | ICD-10-CM | POA: Diagnosis not present

## 2019-08-06 DIAGNOSIS — M7541 Impingement syndrome of right shoulder: Secondary | ICD-10-CM | POA: Diagnosis not present

## 2019-08-07 DIAGNOSIS — S138XXA Sprain of joints and ligaments of other parts of neck, initial encounter: Secondary | ICD-10-CM | POA: Diagnosis not present

## 2019-08-07 DIAGNOSIS — M9901 Segmental and somatic dysfunction of cervical region: Secondary | ICD-10-CM | POA: Diagnosis not present

## 2019-08-07 DIAGNOSIS — M9902 Segmental and somatic dysfunction of thoracic region: Secondary | ICD-10-CM | POA: Diagnosis not present

## 2019-08-07 DIAGNOSIS — S29012A Strain of muscle and tendon of back wall of thorax, initial encounter: Secondary | ICD-10-CM | POA: Diagnosis not present

## 2019-08-08 DIAGNOSIS — M7541 Impingement syndrome of right shoulder: Secondary | ICD-10-CM | POA: Diagnosis not present

## 2019-08-08 DIAGNOSIS — S46011A Strain of muscle(s) and tendon(s) of the rotator cuff of right shoulder, initial encounter: Secondary | ICD-10-CM | POA: Diagnosis not present

## 2019-08-16 DIAGNOSIS — E119 Type 2 diabetes mellitus without complications: Secondary | ICD-10-CM | POA: Diagnosis not present

## 2019-08-26 ENCOUNTER — Other Ambulatory Visit: Payer: Self-pay | Admitting: *Deleted

## 2019-09-02 DIAGNOSIS — E119 Type 2 diabetes mellitus without complications: Secondary | ICD-10-CM | POA: Diagnosis not present

## 2019-09-03 DIAGNOSIS — I10 Essential (primary) hypertension: Secondary | ICD-10-CM | POA: Diagnosis not present

## 2019-09-03 DIAGNOSIS — E119 Type 2 diabetes mellitus without complications: Secondary | ICD-10-CM | POA: Diagnosis not present

## 2019-09-03 DIAGNOSIS — E7800 Pure hypercholesterolemia, unspecified: Secondary | ICD-10-CM | POA: Diagnosis not present

## 2019-09-03 DIAGNOSIS — M8580 Other specified disorders of bone density and structure, unspecified site: Secondary | ICD-10-CM | POA: Diagnosis not present

## 2019-09-04 DIAGNOSIS — S29012A Strain of muscle and tendon of back wall of thorax, initial encounter: Secondary | ICD-10-CM | POA: Diagnosis not present

## 2019-09-04 DIAGNOSIS — M9901 Segmental and somatic dysfunction of cervical region: Secondary | ICD-10-CM | POA: Diagnosis not present

## 2019-09-04 DIAGNOSIS — S138XXA Sprain of joints and ligaments of other parts of neck, initial encounter: Secondary | ICD-10-CM | POA: Diagnosis not present

## 2019-09-04 DIAGNOSIS — M9902 Segmental and somatic dysfunction of thoracic region: Secondary | ICD-10-CM | POA: Diagnosis not present

## 2019-09-13 DIAGNOSIS — E119 Type 2 diabetes mellitus without complications: Secondary | ICD-10-CM | POA: Diagnosis not present

## 2019-09-14 DIAGNOSIS — E119 Type 2 diabetes mellitus without complications: Secondary | ICD-10-CM | POA: Diagnosis not present

## 2019-09-16 DIAGNOSIS — E119 Type 2 diabetes mellitus without complications: Secondary | ICD-10-CM | POA: Diagnosis not present

## 2019-09-19 DIAGNOSIS — L84 Corns and callosities: Secondary | ICD-10-CM | POA: Diagnosis not present

## 2019-09-19 DIAGNOSIS — E1351 Other specified diabetes mellitus with diabetic peripheral angiopathy without gangrene: Secondary | ICD-10-CM | POA: Diagnosis not present

## 2019-09-19 DIAGNOSIS — L602 Onychogryphosis: Secondary | ICD-10-CM | POA: Diagnosis not present

## 2019-09-30 DIAGNOSIS — Z1231 Encounter for screening mammogram for malignant neoplasm of breast: Secondary | ICD-10-CM | POA: Diagnosis not present

## 2019-10-02 DIAGNOSIS — S29012A Strain of muscle and tendon of back wall of thorax, initial encounter: Secondary | ICD-10-CM | POA: Diagnosis not present

## 2019-10-02 DIAGNOSIS — M9902 Segmental and somatic dysfunction of thoracic region: Secondary | ICD-10-CM | POA: Diagnosis not present

## 2019-10-02 DIAGNOSIS — M9901 Segmental and somatic dysfunction of cervical region: Secondary | ICD-10-CM | POA: Diagnosis not present

## 2019-10-02 DIAGNOSIS — S138XXA Sprain of joints and ligaments of other parts of neck, initial encounter: Secondary | ICD-10-CM | POA: Diagnosis not present

## 2019-10-08 DIAGNOSIS — R002 Palpitations: Secondary | ICD-10-CM | POA: Diagnosis not present

## 2019-10-08 DIAGNOSIS — J309 Allergic rhinitis, unspecified: Secondary | ICD-10-CM | POA: Diagnosis not present

## 2019-10-11 DIAGNOSIS — M8580 Other specified disorders of bone density and structure, unspecified site: Secondary | ICD-10-CM | POA: Diagnosis not present

## 2019-10-11 DIAGNOSIS — E119 Type 2 diabetes mellitus without complications: Secondary | ICD-10-CM | POA: Diagnosis not present

## 2019-10-11 DIAGNOSIS — I10 Essential (primary) hypertension: Secondary | ICD-10-CM | POA: Diagnosis not present

## 2019-10-11 DIAGNOSIS — E7800 Pure hypercholesterolemia, unspecified: Secondary | ICD-10-CM | POA: Diagnosis not present

## 2019-10-14 DIAGNOSIS — E119 Type 2 diabetes mellitus without complications: Secondary | ICD-10-CM | POA: Diagnosis not present

## 2019-10-15 DIAGNOSIS — E119 Type 2 diabetes mellitus without complications: Secondary | ICD-10-CM | POA: Diagnosis not present

## 2019-10-30 DIAGNOSIS — M9902 Segmental and somatic dysfunction of thoracic region: Secondary | ICD-10-CM | POA: Diagnosis not present

## 2019-10-30 DIAGNOSIS — M9901 Segmental and somatic dysfunction of cervical region: Secondary | ICD-10-CM | POA: Diagnosis not present

## 2019-10-30 DIAGNOSIS — S138XXA Sprain of joints and ligaments of other parts of neck, initial encounter: Secondary | ICD-10-CM | POA: Diagnosis not present

## 2019-10-30 DIAGNOSIS — S29012A Strain of muscle and tendon of back wall of thorax, initial encounter: Secondary | ICD-10-CM | POA: Diagnosis not present

## 2019-11-12 ENCOUNTER — Other Ambulatory Visit: Payer: Self-pay

## 2019-11-12 ENCOUNTER — Encounter: Payer: Self-pay | Admitting: Nurse Practitioner

## 2019-11-12 ENCOUNTER — Ambulatory Visit (INDEPENDENT_AMBULATORY_CARE_PROVIDER_SITE_OTHER): Payer: Medicare HMO | Admitting: Nurse Practitioner

## 2019-11-12 VITALS — BP 122/78 | Ht 67.5 in | Wt 187.0 lb

## 2019-11-12 DIAGNOSIS — Z01419 Encounter for gynecological examination (general) (routine) without abnormal findings: Secondary | ICD-10-CM | POA: Diagnosis not present

## 2019-11-12 DIAGNOSIS — L309 Dermatitis, unspecified: Secondary | ICD-10-CM

## 2019-11-12 DIAGNOSIS — M8589 Other specified disorders of bone density and structure, multiple sites: Secondary | ICD-10-CM | POA: Insufficient documentation

## 2019-11-12 MED ORDER — BETAMETHASONE VALERATE 0.1 % EX OINT: 1.0000 "application " | TOPICAL_OINTMENT | Freq: Two times a day (BID) | CUTANEOUS | 0 refills | Status: DC

## 2019-11-12 NOTE — Patient Instructions (Signed)
Health Maintenance After Age 72 After age 72, you are at a higher risk for certain long-term diseases and infections as well as injuries from falls. Falls are a major cause of broken bones and head injuries in people who are older than age 72. Getting regular preventive care can help to keep you healthy and well. Preventive care includes getting regular testing and making lifestyle changes as recommended by your health care provider. Talk with your health care provider about:  Which screenings and tests you should have. A screening is a test that checks for a disease when you have no symptoms.  A diet and exercise plan that is right for you. What should I know about screenings and tests to prevent falls? Screening and testing are the best ways to find a health problem early. Early diagnosis and treatment give you the best chance of managing medical conditions that are common after age 72. Certain conditions and lifestyle choices may make you more likely to have a fall. Your health care provider may recommend:  Regular vision checks. Poor vision and conditions such as cataracts can make you more likely to have a fall. If you wear glasses, make sure to get your prescription updated if your vision changes.  Medicine review. Work with your health care provider to regularly review all of the medicines you are taking, including over-the-counter medicines. Ask your health care provider about any side effects that may make you more likely to have a fall. Tell your health care provider if any medicines that you take make you feel dizzy or sleepy.  Osteoporosis screening. Osteoporosis is a condition that causes the bones to get weaker. This can make the bones weak and cause them to break more easily.  Blood pressure screening. Blood pressure changes and medicines to control blood pressure can make you feel dizzy.  Strength and balance checks. Your health care provider may recommend certain tests to check your  strength and balance while standing, walking, or changing positions.  Foot health exam. Foot pain and numbness, as well as not wearing proper footwear, can make you more likely to have a fall.  Depression screening. You may be more likely to have a fall if you have a fear of falling, feel emotionally low, or feel unable to do activities that you used to do.  Alcohol use screening. Using too much alcohol can affect your balance and may make you more likely to have a fall. What actions can I take to lower my risk of falls? General instructions  Talk with your health care provider about your risks for falling. Tell your health care provider if: ? You fall. Be sure to tell your health care provider about all falls, even ones that seem minor. ? You feel dizzy, sleepy, or off-balance.  Take over-the-counter and prescription medicines only as told by your health care provider. These include any supplements.  Eat a healthy diet and maintain a healthy weight. A healthy diet includes low-fat dairy products, low-fat (lean) meats, and fiber from whole grains, beans, and lots of fruits and vegetables. Home safety  Remove any tripping hazards, such as rugs, cords, and clutter.  Install safety equipment such as grab bars in bathrooms and safety rails on stairs.  Keep rooms and walkways well-lit. Activity   Follow a regular exercise program to stay fit. This will help you maintain your balance. Ask your health care provider what types of exercise are appropriate for you.  If you need a cane or   walker, use it as recommended by your health care provider.  Wear supportive shoes that have nonskid soles. Lifestyle  Do not drink alcohol if your health care provider tells you not to drink.  If you drink alcohol, limit how much you have: ? 0-1 drink a day for women. ? 0-2 drinks a day for men.  Be aware of how much alcohol is in your drink. In the [place] one drink equals one typical bottle of beer (12  oz), one-half glass of wine (5 oz), or one shot of hard liquor (1 oz).  Do not use any products that contain nicotine or tobacco, such as cigarettes and e-cigarettes. If you need help quitting, ask your health care provider. Summary  Having a healthy lifestyle and getting preventive care can help to protect your health and wellness after age 72.  Screening and testing are the best way to find a health problem early and help you avoid having a fall. Early diagnosis and treatment give you the best chance for managing medical conditions that are more common for people who are older than age 72.  Falls are a major cause of broken bones and head injuries in people who are older than age 72. Take precautions to prevent a fall at home.  Work with your health care provider to learn what changes you can make to improve your health and wellness and to prevent falls. This information is not intended to replace advice given to you by your health care provider. Make sure you discuss any questions you have with your health care provider. Document Revised: 07/26/2018 Document Reviewed: 02/15/2017 Elsevier Patient Education  2020 Elsevier Inc.  

## 2019-11-12 NOTE — Progress Notes (Signed)
   Kelly Quinn [birth year] [id number]   History:  74 y.o. G0 presents for breast and pelvic exam without GYN complaints. Postmenopausal - no HRT, no bleeding. Normal pap and mammogram history. History of osteopenia, managed by PCP. She does get an occasional rash on groin and under breasts. She used betamethasone in the past with good relief.   Gynecologic History No LMP recorded. Patient is postmenopausal.   Last Pap: no longer screening per guidelines and pt request Last mammogram: 09/2019. Results were: normal Last colonoscopy: 09/27/2016. Results were: polyps Last Dexa: 09/15/2017. Results were: t-score -2.2 FRAX 7.1% / 1.2%  Past medical history, past surgical history, family history and social history were all reviewed and documented in the EPIC chart.  ROS:  A ROS was performed and pertinent positives and negatives are included.  Exam:  Vitals:   11/12/19 0912  Weight: 187 lb (84.8 kg)  Height: 5' 7.5" (1.715 m)   Body mass index is 28.86 kg/m.  General appearance:  Normal Thyroid:  Symmetrical, normal in size, without palpable masses or nodularity. Respiratory  Auscultation:  Clear without wheezing or rhonchi Cardiovascular  Auscultation:  Regular rate, without rubs, murmurs or gallops  Edema/varicosities:  Not grossly evident Abdominal  Soft,nontender, without masses, guarding or rebound.  Liver/spleen:  No organomegaly noted  Hernia:  None appreciated  Skin  Inspection:  Grossly normal   Breasts: Examined lying and sitting.   Right: Without masses, retractions, discharge or axillary adenopathy.   Left: Without masses, retractions, discharge or axillary adenopathy. Gentitourinary   Inguinal/mons:  Normal without inguinal adenopathy  External genitalia:  Normal  BUS/Urethra/Skene's glands:  Normal  Vagina:  Normal  Cervix:  Normal  Uterus:  Anteverted, normal in size, shape and contour.  Midline and mobile  Adnexa/parametria:     Rt: Without masses or  tenderness.   Lt: Without masses or tenderness.  Anus and perineum: Normal  Digital rectal exam: Normal sphincter tone without palpated masses or tenderness  Assessment/Plan:  74 y.o. G for breast and pelvic exam.   Well female exam with routine gynecological exam - Education provided on SBEs, importance of preventative screenings, current guidelines, high calcium diet, regular exercise, and multivitamin daily.   Dermatitis - Plan: betamethasone valerate ointment (VALISONE) 0.1 %. Apply twice a day as needed to groin and under breasts. Keep area clean and dry.   Osteopenia of multiple sites - continue Vitamin D supplement, regular exercise and weightbearing exercises. PCP manages this. She has appt with them next month.   Follow up in 1 year for annual       [place], 9:23 AM 11/12/2019

## 2019-11-13 DIAGNOSIS — E7800 Pure hypercholesterolemia, unspecified: Secondary | ICD-10-CM | POA: Diagnosis not present

## 2019-11-13 DIAGNOSIS — I10 Essential (primary) hypertension: Secondary | ICD-10-CM | POA: Diagnosis not present

## 2019-11-13 DIAGNOSIS — M8580 Other specified disorders of bone density and structure, unspecified site: Secondary | ICD-10-CM | POA: Diagnosis not present

## 2019-11-13 DIAGNOSIS — E119 Type 2 diabetes mellitus without complications: Secondary | ICD-10-CM | POA: Diagnosis not present

## 2019-11-14 DIAGNOSIS — E119 Type 2 diabetes mellitus without complications: Secondary | ICD-10-CM | POA: Diagnosis not present

## 2019-11-27 DIAGNOSIS — S138XXA Sprain of joints and ligaments of other parts of neck, initial encounter: Secondary | ICD-10-CM | POA: Diagnosis not present

## 2019-11-27 DIAGNOSIS — M9901 Segmental and somatic dysfunction of cervical region: Secondary | ICD-10-CM | POA: Diagnosis not present

## 2019-11-27 DIAGNOSIS — S335XXA Sprain of ligaments of lumbar spine, initial encounter: Secondary | ICD-10-CM | POA: Diagnosis not present

## 2019-11-27 DIAGNOSIS — M9902 Segmental and somatic dysfunction of thoracic region: Secondary | ICD-10-CM | POA: Diagnosis not present

## 2019-11-27 DIAGNOSIS — S29012A Strain of muscle and tendon of back wall of thorax, initial encounter: Secondary | ICD-10-CM | POA: Diagnosis not present

## 2019-11-27 DIAGNOSIS — M9903 Segmental and somatic dysfunction of lumbar region: Secondary | ICD-10-CM | POA: Diagnosis not present

## 2019-12-10 DIAGNOSIS — E119 Type 2 diabetes mellitus without complications: Secondary | ICD-10-CM | POA: Diagnosis not present

## 2019-12-10 DIAGNOSIS — I10 Essential (primary) hypertension: Secondary | ICD-10-CM | POA: Diagnosis not present

## 2019-12-10 DIAGNOSIS — E7800 Pure hypercholesterolemia, unspecified: Secondary | ICD-10-CM | POA: Diagnosis not present

## 2019-12-10 DIAGNOSIS — M8580 Other specified disorders of bone density and structure, unspecified site: Secondary | ICD-10-CM | POA: Diagnosis not present

## 2019-12-15 DIAGNOSIS — E119 Type 2 diabetes mellitus without complications: Secondary | ICD-10-CM | POA: Diagnosis not present

## 2019-12-16 DIAGNOSIS — E7800 Pure hypercholesterolemia, unspecified: Secondary | ICD-10-CM | POA: Diagnosis not present

## 2019-12-16 DIAGNOSIS — R030 Elevated blood-pressure reading, without diagnosis of hypertension: Secondary | ICD-10-CM | POA: Diagnosis not present

## 2019-12-16 DIAGNOSIS — J309 Allergic rhinitis, unspecified: Secondary | ICD-10-CM | POA: Diagnosis not present

## 2019-12-16 DIAGNOSIS — M8580 Other specified disorders of bone density and structure, unspecified site: Secondary | ICD-10-CM | POA: Diagnosis not present

## 2019-12-16 DIAGNOSIS — Z7984 Long term (current) use of oral hypoglycemic drugs: Secondary | ICD-10-CM | POA: Diagnosis not present

## 2019-12-16 DIAGNOSIS — R600 Localized edema: Secondary | ICD-10-CM | POA: Diagnosis not present

## 2019-12-16 DIAGNOSIS — E119 Type 2 diabetes mellitus without complications: Secondary | ICD-10-CM | POA: Diagnosis not present

## 2019-12-20 DIAGNOSIS — E1142 Type 2 diabetes mellitus with diabetic polyneuropathy: Secondary | ICD-10-CM | POA: Diagnosis not present

## 2019-12-20 DIAGNOSIS — L84 Corns and callosities: Secondary | ICD-10-CM | POA: Diagnosis not present

## 2019-12-20 DIAGNOSIS — L603 Nail dystrophy: Secondary | ICD-10-CM | POA: Diagnosis not present

## 2019-12-25 DIAGNOSIS — S29012A Strain of muscle and tendon of back wall of thorax, initial encounter: Secondary | ICD-10-CM | POA: Diagnosis not present

## 2019-12-25 DIAGNOSIS — M9903 Segmental and somatic dysfunction of lumbar region: Secondary | ICD-10-CM | POA: Diagnosis not present

## 2019-12-25 DIAGNOSIS — M9901 Segmental and somatic dysfunction of cervical region: Secondary | ICD-10-CM | POA: Diagnosis not present

## 2019-12-25 DIAGNOSIS — S138XXA Sprain of joints and ligaments of other parts of neck, initial encounter: Secondary | ICD-10-CM | POA: Diagnosis not present

## 2019-12-25 DIAGNOSIS — S335XXA Sprain of ligaments of lumbar spine, initial encounter: Secondary | ICD-10-CM | POA: Diagnosis not present

## 2019-12-25 DIAGNOSIS — M9902 Segmental and somatic dysfunction of thoracic region: Secondary | ICD-10-CM | POA: Diagnosis not present

## 2019-12-30 DIAGNOSIS — M85852 Other specified disorders of bone density and structure, left thigh: Secondary | ICD-10-CM | POA: Diagnosis not present

## 2019-12-30 DIAGNOSIS — M810 Age-related osteoporosis without current pathological fracture: Secondary | ICD-10-CM | POA: Diagnosis not present

## 2019-12-30 DIAGNOSIS — M85851 Other specified disorders of bone density and structure, right thigh: Secondary | ICD-10-CM | POA: Diagnosis not present

## 2020-01-07 DIAGNOSIS — I10 Essential (primary) hypertension: Secondary | ICD-10-CM | POA: Diagnosis not present

## 2020-01-07 DIAGNOSIS — E119 Type 2 diabetes mellitus without complications: Secondary | ICD-10-CM | POA: Diagnosis not present

## 2020-01-07 DIAGNOSIS — M8580 Other specified disorders of bone density and structure, unspecified site: Secondary | ICD-10-CM | POA: Diagnosis not present

## 2020-01-07 DIAGNOSIS — E7800 Pure hypercholesterolemia, unspecified: Secondary | ICD-10-CM | POA: Diagnosis not present

## 2020-01-10 DIAGNOSIS — E119 Type 2 diabetes mellitus without complications: Secondary | ICD-10-CM | POA: Diagnosis not present

## 2020-01-16 DIAGNOSIS — L720 Epidermal cyst: Secondary | ICD-10-CM | POA: Diagnosis not present

## 2020-01-16 DIAGNOSIS — D2371 Other benign neoplasm of skin of right lower limb, including hip: Secondary | ICD-10-CM | POA: Diagnosis not present

## 2020-01-16 DIAGNOSIS — D2262 Melanocytic nevi of left upper limb, including shoulder: Secondary | ICD-10-CM | POA: Diagnosis not present

## 2020-01-16 DIAGNOSIS — L819 Disorder of pigmentation, unspecified: Secondary | ICD-10-CM | POA: Diagnosis not present

## 2020-01-16 DIAGNOSIS — L814 Other melanin hyperpigmentation: Secondary | ICD-10-CM | POA: Diagnosis not present

## 2020-01-16 DIAGNOSIS — L821 Other seborrheic keratosis: Secondary | ICD-10-CM | POA: Diagnosis not present

## 2020-01-16 DIAGNOSIS — D1801 Hemangioma of skin and subcutaneous tissue: Secondary | ICD-10-CM | POA: Diagnosis not present

## 2020-01-16 DIAGNOSIS — L820 Inflamed seborrheic keratosis: Secondary | ICD-10-CM | POA: Diagnosis not present

## 2020-01-20 DIAGNOSIS — E7800 Pure hypercholesterolemia, unspecified: Secondary | ICD-10-CM | POA: Diagnosis not present

## 2020-01-20 DIAGNOSIS — I10 Essential (primary) hypertension: Secondary | ICD-10-CM | POA: Diagnosis not present

## 2020-01-20 DIAGNOSIS — E119 Type 2 diabetes mellitus without complications: Secondary | ICD-10-CM | POA: Diagnosis not present

## 2020-01-20 DIAGNOSIS — M8580 Other specified disorders of bone density and structure, unspecified site: Secondary | ICD-10-CM | POA: Diagnosis not present

## 2020-01-22 DIAGNOSIS — S335XXA Sprain of ligaments of lumbar spine, initial encounter: Secondary | ICD-10-CM | POA: Diagnosis not present

## 2020-01-22 DIAGNOSIS — M9902 Segmental and somatic dysfunction of thoracic region: Secondary | ICD-10-CM | POA: Diagnosis not present

## 2020-01-22 DIAGNOSIS — S29012A Strain of muscle and tendon of back wall of thorax, initial encounter: Secondary | ICD-10-CM | POA: Diagnosis not present

## 2020-01-22 DIAGNOSIS — M9901 Segmental and somatic dysfunction of cervical region: Secondary | ICD-10-CM | POA: Diagnosis not present

## 2020-01-22 DIAGNOSIS — S138XXA Sprain of joints and ligaments of other parts of neck, initial encounter: Secondary | ICD-10-CM | POA: Diagnosis not present

## 2020-01-22 DIAGNOSIS — M9903 Segmental and somatic dysfunction of lumbar region: Secondary | ICD-10-CM | POA: Diagnosis not present

## 2020-01-28 DIAGNOSIS — E119 Type 2 diabetes mellitus without complications: Secondary | ICD-10-CM | POA: Diagnosis not present

## 2020-02-04 ENCOUNTER — Other Ambulatory Visit: Payer: Self-pay | Admitting: Nurse Practitioner

## 2020-02-04 DIAGNOSIS — L309 Dermatitis, unspecified: Secondary | ICD-10-CM

## 2020-02-06 DIAGNOSIS — L718 Other rosacea: Secondary | ICD-10-CM | POA: Diagnosis not present

## 2020-02-06 DIAGNOSIS — L245 Irritant contact dermatitis due to other chemical products: Secondary | ICD-10-CM | POA: Diagnosis not present

## 2020-02-11 DIAGNOSIS — E119 Type 2 diabetes mellitus without complications: Secondary | ICD-10-CM | POA: Diagnosis not present

## 2020-02-12 DIAGNOSIS — E119 Type 2 diabetes mellitus without complications: Secondary | ICD-10-CM | POA: Diagnosis not present

## 2020-02-18 DIAGNOSIS — I10 Essential (primary) hypertension: Secondary | ICD-10-CM | POA: Diagnosis not present

## 2020-02-18 DIAGNOSIS — M8580 Other specified disorders of bone density and structure, unspecified site: Secondary | ICD-10-CM | POA: Diagnosis not present

## 2020-02-18 DIAGNOSIS — E7800 Pure hypercholesterolemia, unspecified: Secondary | ICD-10-CM | POA: Diagnosis not present

## 2020-02-18 DIAGNOSIS — E119 Type 2 diabetes mellitus without complications: Secondary | ICD-10-CM | POA: Diagnosis not present

## 2020-02-25 DIAGNOSIS — M9901 Segmental and somatic dysfunction of cervical region: Secondary | ICD-10-CM | POA: Diagnosis not present

## 2020-02-25 DIAGNOSIS — S138XXA Sprain of joints and ligaments of other parts of neck, initial encounter: Secondary | ICD-10-CM | POA: Diagnosis not present

## 2020-02-25 DIAGNOSIS — S29012A Strain of muscle and tendon of back wall of thorax, initial encounter: Secondary | ICD-10-CM | POA: Diagnosis not present

## 2020-02-25 DIAGNOSIS — M9903 Segmental and somatic dysfunction of lumbar region: Secondary | ICD-10-CM | POA: Diagnosis not present

## 2020-02-25 DIAGNOSIS — S335XXA Sprain of ligaments of lumbar spine, initial encounter: Secondary | ICD-10-CM | POA: Diagnosis not present

## 2020-02-25 DIAGNOSIS — M9902 Segmental and somatic dysfunction of thoracic region: Secondary | ICD-10-CM | POA: Diagnosis not present

## 2020-02-26 DIAGNOSIS — L603 Nail dystrophy: Secondary | ICD-10-CM | POA: Diagnosis not present

## 2020-02-26 DIAGNOSIS — E1351 Other specified diabetes mellitus with diabetic peripheral angiopathy without gangrene: Secondary | ICD-10-CM | POA: Diagnosis not present

## 2020-02-26 DIAGNOSIS — L84 Corns and callosities: Secondary | ICD-10-CM | POA: Diagnosis not present

## 2020-02-27 DIAGNOSIS — E119 Type 2 diabetes mellitus without complications: Secondary | ICD-10-CM | POA: Diagnosis not present

## 2020-02-27 DIAGNOSIS — H5211 Myopia, right eye: Secondary | ICD-10-CM | POA: Diagnosis not present

## 2020-02-27 DIAGNOSIS — H524 Presbyopia: Secondary | ICD-10-CM | POA: Diagnosis not present

## 2020-02-27 DIAGNOSIS — H52203 Unspecified astigmatism, bilateral: Secondary | ICD-10-CM | POA: Diagnosis not present

## 2020-02-27 DIAGNOSIS — H5202 Hypermetropia, left eye: Secondary | ICD-10-CM | POA: Diagnosis not present

## 2020-02-27 DIAGNOSIS — Z961 Presence of intraocular lens: Secondary | ICD-10-CM | POA: Diagnosis not present

## 2020-03-16 DIAGNOSIS — E119 Type 2 diabetes mellitus without complications: Secondary | ICD-10-CM | POA: Diagnosis not present

## 2020-03-17 DIAGNOSIS — E119 Type 2 diabetes mellitus without complications: Secondary | ICD-10-CM | POA: Diagnosis not present

## 2020-03-24 ENCOUNTER — Telehealth: Payer: Self-pay | Admitting: Gastroenterology

## 2020-03-24 NOTE — Telephone Encounter (Signed)
Patient calling with some concerns said when she has a BM she also gets a sharp pain in the rectal area but its not all the time.

## 2020-03-24 NOTE — Telephone Encounter (Signed)
Patient reports that she has a sharp quick pain in her rectum.  Not associated with any particular activity or bowel movements.  She is advised that it sounds like rectal spasm.  She states pain is quick then goes away.  She is advised that if the pain bothers here she should schedule an office visit.  She denies any other GI concerns or complaints.  She will call back to schedule if the spasms become more frequent or bothersome.  She is also aware to call for change in bowel habits, rectal bleeding, or other GI concerns.

## 2020-03-25 DIAGNOSIS — S29012A Strain of muscle and tendon of back wall of thorax, initial encounter: Secondary | ICD-10-CM | POA: Diagnosis not present

## 2020-03-25 DIAGNOSIS — M9903 Segmental and somatic dysfunction of lumbar region: Secondary | ICD-10-CM | POA: Diagnosis not present

## 2020-03-25 DIAGNOSIS — S138XXA Sprain of joints and ligaments of other parts of neck, initial encounter: Secondary | ICD-10-CM | POA: Diagnosis not present

## 2020-03-25 DIAGNOSIS — M9901 Segmental and somatic dysfunction of cervical region: Secondary | ICD-10-CM | POA: Diagnosis not present

## 2020-03-25 DIAGNOSIS — S335XXA Sprain of ligaments of lumbar spine, initial encounter: Secondary | ICD-10-CM | POA: Diagnosis not present

## 2020-03-25 DIAGNOSIS — M9902 Segmental and somatic dysfunction of thoracic region: Secondary | ICD-10-CM | POA: Diagnosis not present

## 2020-04-02 DIAGNOSIS — L650 Telogen effluvium: Secondary | ICD-10-CM | POA: Diagnosis not present

## 2020-04-02 DIAGNOSIS — L089 Local infection of the skin and subcutaneous tissue, unspecified: Secondary | ICD-10-CM | POA: Diagnosis not present

## 2020-04-02 DIAGNOSIS — L658 Other specified nonscarring hair loss: Secondary | ICD-10-CM | POA: Diagnosis not present

## 2020-04-02 DIAGNOSIS — L219 Seborrheic dermatitis, unspecified: Secondary | ICD-10-CM | POA: Diagnosis not present

## 2020-04-13 DIAGNOSIS — L309 Dermatitis, unspecified: Secondary | ICD-10-CM | POA: Diagnosis not present

## 2020-04-13 DIAGNOSIS — L03115 Cellulitis of right lower limb: Secondary | ICD-10-CM | POA: Diagnosis not present

## 2020-04-16 DIAGNOSIS — L245 Irritant contact dermatitis due to other chemical products: Secondary | ICD-10-CM | POA: Diagnosis not present

## 2020-04-16 DIAGNOSIS — E119 Type 2 diabetes mellitus without complications: Secondary | ICD-10-CM | POA: Diagnosis not present

## 2020-04-16 DIAGNOSIS — L03115 Cellulitis of right lower limb: Secondary | ICD-10-CM | POA: Diagnosis not present

## 2020-04-17 DIAGNOSIS — E119 Type 2 diabetes mellitus without complications: Secondary | ICD-10-CM | POA: Diagnosis not present

## 2020-04-22 DIAGNOSIS — M9902 Segmental and somatic dysfunction of thoracic region: Secondary | ICD-10-CM | POA: Diagnosis not present

## 2020-04-22 DIAGNOSIS — S29012A Strain of muscle and tendon of back wall of thorax, initial encounter: Secondary | ICD-10-CM | POA: Diagnosis not present

## 2020-04-22 DIAGNOSIS — M9903 Segmental and somatic dysfunction of lumbar region: Secondary | ICD-10-CM | POA: Diagnosis not present

## 2020-04-22 DIAGNOSIS — M9901 Segmental and somatic dysfunction of cervical region: Secondary | ICD-10-CM | POA: Diagnosis not present

## 2020-04-22 DIAGNOSIS — S138XXA Sprain of joints and ligaments of other parts of neck, initial encounter: Secondary | ICD-10-CM | POA: Diagnosis not present

## 2020-04-22 DIAGNOSIS — S335XXA Sprain of ligaments of lumbar spine, initial encounter: Secondary | ICD-10-CM | POA: Diagnosis not present

## 2020-05-07 DIAGNOSIS — E119 Type 2 diabetes mellitus without complications: Secondary | ICD-10-CM | POA: Diagnosis not present

## 2020-05-07 DIAGNOSIS — E7800 Pure hypercholesterolemia, unspecified: Secondary | ICD-10-CM | POA: Diagnosis not present

## 2020-05-07 DIAGNOSIS — M8580 Other specified disorders of bone density and structure, unspecified site: Secondary | ICD-10-CM | POA: Diagnosis not present

## 2020-05-07 DIAGNOSIS — I10 Essential (primary) hypertension: Secondary | ICD-10-CM | POA: Diagnosis not present

## 2020-05-13 DIAGNOSIS — E1142 Type 2 diabetes mellitus with diabetic polyneuropathy: Secondary | ICD-10-CM | POA: Diagnosis not present

## 2020-05-13 DIAGNOSIS — L603 Nail dystrophy: Secondary | ICD-10-CM | POA: Diagnosis not present

## 2020-05-13 DIAGNOSIS — L84 Corns and callosities: Secondary | ICD-10-CM | POA: Diagnosis not present

## 2020-05-17 DIAGNOSIS — E119 Type 2 diabetes mellitus without complications: Secondary | ICD-10-CM | POA: Diagnosis not present

## 2020-05-18 DIAGNOSIS — E119 Type 2 diabetes mellitus without complications: Secondary | ICD-10-CM | POA: Diagnosis not present

## 2020-05-20 DIAGNOSIS — S335XXA Sprain of ligaments of lumbar spine, initial encounter: Secondary | ICD-10-CM | POA: Diagnosis not present

## 2020-05-20 DIAGNOSIS — M9901 Segmental and somatic dysfunction of cervical region: Secondary | ICD-10-CM | POA: Diagnosis not present

## 2020-05-20 DIAGNOSIS — M9903 Segmental and somatic dysfunction of lumbar region: Secondary | ICD-10-CM | POA: Diagnosis not present

## 2020-05-20 DIAGNOSIS — M9902 Segmental and somatic dysfunction of thoracic region: Secondary | ICD-10-CM | POA: Diagnosis not present

## 2020-05-20 DIAGNOSIS — S138XXA Sprain of joints and ligaments of other parts of neck, initial encounter: Secondary | ICD-10-CM | POA: Diagnosis not present

## 2020-05-20 DIAGNOSIS — S29012A Strain of muscle and tendon of back wall of thorax, initial encounter: Secondary | ICD-10-CM | POA: Diagnosis not present

## 2020-05-27 DIAGNOSIS — S40261A Insect bite (nonvenomous) of right shoulder, initial encounter: Secondary | ICD-10-CM | POA: Diagnosis not present

## 2020-05-27 DIAGNOSIS — L820 Inflamed seborrheic keratosis: Secondary | ICD-10-CM | POA: Diagnosis not present

## 2020-06-01 DIAGNOSIS — R21 Rash and other nonspecific skin eruption: Secondary | ICD-10-CM | POA: Diagnosis not present

## 2020-06-01 DIAGNOSIS — W57XXXA Bitten or stung by nonvenomous insect and other nonvenomous arthropods, initial encounter: Secondary | ICD-10-CM | POA: Diagnosis not present

## 2020-06-02 DIAGNOSIS — E119 Type 2 diabetes mellitus without complications: Secondary | ICD-10-CM | POA: Diagnosis not present

## 2020-06-02 DIAGNOSIS — M8580 Other specified disorders of bone density and structure, unspecified site: Secondary | ICD-10-CM | POA: Diagnosis not present

## 2020-06-02 DIAGNOSIS — E7800 Pure hypercholesterolemia, unspecified: Secondary | ICD-10-CM | POA: Diagnosis not present

## 2020-06-02 DIAGNOSIS — I10 Essential (primary) hypertension: Secondary | ICD-10-CM | POA: Diagnosis not present

## 2020-06-05 DIAGNOSIS — L219 Seborrheic dermatitis, unspecified: Secondary | ICD-10-CM | POA: Diagnosis not present

## 2020-06-05 DIAGNOSIS — W57XXXA Bitten or stung by nonvenomous insect and other nonvenomous arthropods, initial encounter: Secondary | ICD-10-CM | POA: Diagnosis not present

## 2020-06-05 DIAGNOSIS — L810 Postinflammatory hyperpigmentation: Secondary | ICD-10-CM | POA: Diagnosis not present

## 2020-06-11 DIAGNOSIS — R21 Rash and other nonspecific skin eruption: Secondary | ICD-10-CM | POA: Diagnosis not present

## 2020-06-11 DIAGNOSIS — J3089 Other allergic rhinitis: Secondary | ICD-10-CM | POA: Diagnosis not present

## 2020-06-11 DIAGNOSIS — J301 Allergic rhinitis due to pollen: Secondary | ICD-10-CM | POA: Diagnosis not present

## 2020-06-11 DIAGNOSIS — L509 Urticaria, unspecified: Secondary | ICD-10-CM | POA: Diagnosis not present

## 2020-06-11 DIAGNOSIS — T783XXD Angioneurotic edema, subsequent encounter: Secondary | ICD-10-CM | POA: Diagnosis not present

## 2020-06-11 DIAGNOSIS — W57XXXS Bitten or stung by nonvenomous insect and other nonvenomous arthropods, sequela: Secondary | ICD-10-CM | POA: Diagnosis not present

## 2020-06-16 DIAGNOSIS — E7800 Pure hypercholesterolemia, unspecified: Secondary | ICD-10-CM | POA: Diagnosis not present

## 2020-06-16 DIAGNOSIS — R600 Localized edema: Secondary | ICD-10-CM | POA: Diagnosis not present

## 2020-06-16 DIAGNOSIS — I10 Essential (primary) hypertension: Secondary | ICD-10-CM | POA: Diagnosis not present

## 2020-06-16 DIAGNOSIS — E119 Type 2 diabetes mellitus without complications: Secondary | ICD-10-CM | POA: Diagnosis not present

## 2020-06-16 DIAGNOSIS — Z0000 Encounter for general adult medical examination without abnormal findings: Secondary | ICD-10-CM | POA: Diagnosis not present

## 2020-06-16 DIAGNOSIS — L309 Dermatitis, unspecified: Secondary | ICD-10-CM | POA: Diagnosis not present

## 2020-06-17 DIAGNOSIS — S335XXA Sprain of ligaments of lumbar spine, initial encounter: Secondary | ICD-10-CM | POA: Diagnosis not present

## 2020-06-17 DIAGNOSIS — M9901 Segmental and somatic dysfunction of cervical region: Secondary | ICD-10-CM | POA: Diagnosis not present

## 2020-06-17 DIAGNOSIS — S29012A Strain of muscle and tendon of back wall of thorax, initial encounter: Secondary | ICD-10-CM | POA: Diagnosis not present

## 2020-06-17 DIAGNOSIS — M9903 Segmental and somatic dysfunction of lumbar region: Secondary | ICD-10-CM | POA: Diagnosis not present

## 2020-06-17 DIAGNOSIS — M9902 Segmental and somatic dysfunction of thoracic region: Secondary | ICD-10-CM | POA: Diagnosis not present

## 2020-06-17 DIAGNOSIS — S138XXA Sprain of joints and ligaments of other parts of neck, initial encounter: Secondary | ICD-10-CM | POA: Diagnosis not present

## 2020-06-23 DIAGNOSIS — E1351 Other specified diabetes mellitus with diabetic peripheral angiopathy without gangrene: Secondary | ICD-10-CM | POA: Diagnosis not present

## 2020-07-15 DIAGNOSIS — S138XXA Sprain of joints and ligaments of other parts of neck, initial encounter: Secondary | ICD-10-CM | POA: Diagnosis not present

## 2020-07-15 DIAGNOSIS — M9903 Segmental and somatic dysfunction of lumbar region: Secondary | ICD-10-CM | POA: Diagnosis not present

## 2020-07-15 DIAGNOSIS — S335XXA Sprain of ligaments of lumbar spine, initial encounter: Secondary | ICD-10-CM | POA: Diagnosis not present

## 2020-07-15 DIAGNOSIS — S29012A Strain of muscle and tendon of back wall of thorax, initial encounter: Secondary | ICD-10-CM | POA: Diagnosis not present

## 2020-07-15 DIAGNOSIS — M9901 Segmental and somatic dysfunction of cervical region: Secondary | ICD-10-CM | POA: Diagnosis not present

## 2020-07-15 DIAGNOSIS — M9902 Segmental and somatic dysfunction of thoracic region: Secondary | ICD-10-CM | POA: Diagnosis not present

## 2020-07-16 DIAGNOSIS — L84 Corns and callosities: Secondary | ICD-10-CM | POA: Diagnosis not present

## 2020-07-16 DIAGNOSIS — L603 Nail dystrophy: Secondary | ICD-10-CM | POA: Diagnosis not present

## 2020-07-16 DIAGNOSIS — E1351 Other specified diabetes mellitus with diabetic peripheral angiopathy without gangrene: Secondary | ICD-10-CM | POA: Diagnosis not present

## 2020-08-04 DIAGNOSIS — L309 Dermatitis, unspecified: Secondary | ICD-10-CM | POA: Diagnosis not present

## 2020-08-04 DIAGNOSIS — J029 Acute pharyngitis, unspecified: Secondary | ICD-10-CM | POA: Diagnosis not present

## 2020-08-04 DIAGNOSIS — L659 Nonscarring hair loss, unspecified: Secondary | ICD-10-CM | POA: Diagnosis not present

## 2020-08-04 DIAGNOSIS — R634 Abnormal weight loss: Secondary | ICD-10-CM | POA: Diagnosis not present

## 2020-08-13 DIAGNOSIS — E119 Type 2 diabetes mellitus without complications: Secondary | ICD-10-CM | POA: Diagnosis not present

## 2020-08-27 DIAGNOSIS — M9901 Segmental and somatic dysfunction of cervical region: Secondary | ICD-10-CM | POA: Diagnosis not present

## 2020-08-27 DIAGNOSIS — M9902 Segmental and somatic dysfunction of thoracic region: Secondary | ICD-10-CM | POA: Diagnosis not present

## 2020-08-27 DIAGNOSIS — S335XXA Sprain of ligaments of lumbar spine, initial encounter: Secondary | ICD-10-CM | POA: Diagnosis not present

## 2020-08-27 DIAGNOSIS — S29012A Strain of muscle and tendon of back wall of thorax, initial encounter: Secondary | ICD-10-CM | POA: Diagnosis not present

## 2020-08-27 DIAGNOSIS — M9903 Segmental and somatic dysfunction of lumbar region: Secondary | ICD-10-CM | POA: Diagnosis not present

## 2020-08-27 DIAGNOSIS — S138XXA Sprain of joints and ligaments of other parts of neck, initial encounter: Secondary | ICD-10-CM | POA: Diagnosis not present

## 2020-09-11 DIAGNOSIS — E119 Type 2 diabetes mellitus without complications: Secondary | ICD-10-CM | POA: Diagnosis not present

## 2020-09-23 DIAGNOSIS — S335XXA Sprain of ligaments of lumbar spine, initial encounter: Secondary | ICD-10-CM | POA: Diagnosis not present

## 2020-09-23 DIAGNOSIS — M9903 Segmental and somatic dysfunction of lumbar region: Secondary | ICD-10-CM | POA: Diagnosis not present

## 2020-09-23 DIAGNOSIS — M9902 Segmental and somatic dysfunction of thoracic region: Secondary | ICD-10-CM | POA: Diagnosis not present

## 2020-09-23 DIAGNOSIS — S138XXA Sprain of joints and ligaments of other parts of neck, initial encounter: Secondary | ICD-10-CM | POA: Diagnosis not present

## 2020-09-23 DIAGNOSIS — M9901 Segmental and somatic dysfunction of cervical region: Secondary | ICD-10-CM | POA: Diagnosis not present

## 2020-09-23 DIAGNOSIS — S29012A Strain of muscle and tendon of back wall of thorax, initial encounter: Secondary | ICD-10-CM | POA: Diagnosis not present

## 2020-09-24 DIAGNOSIS — L603 Nail dystrophy: Secondary | ICD-10-CM | POA: Diagnosis not present

## 2020-09-24 DIAGNOSIS — L84 Corns and callosities: Secondary | ICD-10-CM | POA: Diagnosis not present

## 2020-09-24 DIAGNOSIS — E1351 Other specified diabetes mellitus with diabetic peripheral angiopathy without gangrene: Secondary | ICD-10-CM | POA: Diagnosis not present

## 2020-10-01 DIAGNOSIS — Z803 Family history of malignant neoplasm of breast: Secondary | ICD-10-CM | POA: Diagnosis not present

## 2020-10-01 DIAGNOSIS — Z1231 Encounter for screening mammogram for malignant neoplasm of breast: Secondary | ICD-10-CM | POA: Diagnosis not present

## 2020-10-12 DIAGNOSIS — E119 Type 2 diabetes mellitus without complications: Secondary | ICD-10-CM | POA: Diagnosis not present

## 2020-10-12 DIAGNOSIS — E7800 Pure hypercholesterolemia, unspecified: Secondary | ICD-10-CM | POA: Diagnosis not present

## 2020-10-12 DIAGNOSIS — I10 Essential (primary) hypertension: Secondary | ICD-10-CM | POA: Diagnosis not present

## 2020-10-12 DIAGNOSIS — M8580 Other specified disorders of bone density and structure, unspecified site: Secondary | ICD-10-CM | POA: Diagnosis not present

## 2020-10-14 DIAGNOSIS — E119 Type 2 diabetes mellitus without complications: Secondary | ICD-10-CM | POA: Diagnosis not present

## 2020-10-22 DIAGNOSIS — M9902 Segmental and somatic dysfunction of thoracic region: Secondary | ICD-10-CM | POA: Diagnosis not present

## 2020-10-22 DIAGNOSIS — M9903 Segmental and somatic dysfunction of lumbar region: Secondary | ICD-10-CM | POA: Diagnosis not present

## 2020-10-22 DIAGNOSIS — S335XXA Sprain of ligaments of lumbar spine, initial encounter: Secondary | ICD-10-CM | POA: Diagnosis not present

## 2020-10-22 DIAGNOSIS — S138XXA Sprain of joints and ligaments of other parts of neck, initial encounter: Secondary | ICD-10-CM | POA: Diagnosis not present

## 2020-10-22 DIAGNOSIS — S29012A Strain of muscle and tendon of back wall of thorax, initial encounter: Secondary | ICD-10-CM | POA: Diagnosis not present

## 2020-10-22 DIAGNOSIS — L738 Other specified follicular disorders: Secondary | ICD-10-CM | POA: Diagnosis not present

## 2020-10-22 DIAGNOSIS — M9901 Segmental and somatic dysfunction of cervical region: Secondary | ICD-10-CM | POA: Diagnosis not present

## 2020-10-22 DIAGNOSIS — L218 Other seborrheic dermatitis: Secondary | ICD-10-CM | POA: Diagnosis not present

## 2020-11-11 NOTE — Progress Notes (Signed)
   Kelly Quinn [birth year] [id number]   History:  75 y.o. G0 presents for breast and pelvic exam without GYN complaints. Postmenopausal - no HRT, no bleeding. Normal pap and mammogram history. History of osteopenia, managed by PCP. Took Fosamax for a year but did not tolerate. Complains of redness in groin that comes and goes.   Gynecologic History No LMP recorded. Patient is postmenopausal.   Last Pap: no longer screening per guidelines Last mammogram: 09/2020. Results were: normal Last colonoscopy: 09/27/2016. Results were: polyps, 5-year repeat Last Dexa: 09/15/2017. Results were: t-score -2.2 FRAX 7.1% / 1.2%  Past medical history, past surgical history, family history and social history were all reviewed and documented in the EPIC chart. Widowed. Adopted daughter with 3 children ages 57, 74, and 13.   ROS:  A ROS was performed and pertinent positives and negatives are included.  Exam:  Vitals:   11/12/20 0904  BP: 120/82  Weight: 169 lb (76.7 kg)  Height: '5\' 7"'$  (1.702 m)    Body mass index is 26.47 kg/m.  General appearance:  Normal Thyroid:  Symmetrical, normal in size, without palpable masses or nodularity. Respiratory  Auscultation:  Clear without wheezing or rhonchi Cardiovascular  Auscultation:  Regular rate, without rubs, murmurs or gallops  Edema/varicosities:  Not grossly evident Abdominal  Soft,nontender, without masses, guarding or rebound.  Liver/spleen:  No organomegaly noted  Hernia:  None appreciated  Skin  Inspection:  Grossly normal   Breasts: Examined lying and sitting.   Right: Without masses, retractions, discharge or axillary adenopathy.   Left: Without masses, retractions, discharge or axillary adenopathy. Gentitourinary   Inguinal/mons:  Normal without inguinal adenopathy  External genitalia:  Normal  BUS/Urethra/Skene's glands:  Normal  Vagina:  Atrophic changes  Cervix:  Atrophic changes  Uterus:  Anteverted, normal in size, shape and  contour.  Midline and mobile  Adnexa/parametria:     Rt: Without masses or tenderness.   Lt: Without masses or tenderness.  Anus and perineum: Normal  Digital rectal exam: Normal sphincter tone without palpated masses or tenderness  Assessment/Plan:  75 y.o. G for breast and pelvic exam.   Well female exam with routine gynecological exam - Education provided on SBEs, importance of preventative screenings, current guidelines, high calcium diet, regular exercise, and multivitamin daily. Labs with PCP.   Osteopenia of multiple sites - continue Vitamin D supplement, regular exercise and weightbearing exercises. PCP manages this. She was on Fosamax x 1 year but did not tolerated. Recommend repeating Dexa and she will follow up with PCP about this.   Atopic dermatitis, unspecified type - Plan: triamcinolone cream (KENALOG) 0.1 % as needed.   Screening for cervical cancer - Normal Pap history. No longer screening per guidelines.   Screening for breast cancer - Normal mammogram history.  Continue annual screenings.  Normal breast exam today.  Screening for colon cancer - 2018 colonoscopy. Will repeat at GI's recommended interval.   Follow up in 1 year for annual       [place], 9:32 AM 11/12/2020

## 2020-11-12 ENCOUNTER — Other Ambulatory Visit: Payer: Self-pay

## 2020-11-12 ENCOUNTER — Encounter: Payer: Self-pay | Admitting: Nurse Practitioner

## 2020-11-12 ENCOUNTER — Ambulatory Visit (INDEPENDENT_AMBULATORY_CARE_PROVIDER_SITE_OTHER): Payer: Medicare HMO | Admitting: Nurse Practitioner

## 2020-11-12 VITALS — BP 120/82 | Ht 67.0 in | Wt 169.0 lb

## 2020-11-12 DIAGNOSIS — Z780 Asymptomatic menopausal state: Secondary | ICD-10-CM | POA: Diagnosis not present

## 2020-11-12 DIAGNOSIS — Z01419 Encounter for gynecological examination (general) (routine) without abnormal findings: Secondary | ICD-10-CM

## 2020-11-12 DIAGNOSIS — L209 Atopic dermatitis, unspecified: Secondary | ICD-10-CM | POA: Diagnosis not present

## 2020-11-12 DIAGNOSIS — R69 Illness, unspecified: Secondary | ICD-10-CM | POA: Diagnosis not present

## 2020-11-12 DIAGNOSIS — M8589 Other specified disorders of bone density and structure, multiple sites: Secondary | ICD-10-CM

## 2020-11-12 MED ORDER — TRIAMCINOLONE ACETONIDE 0.1 % EX CREA: TOPICAL_CREAM | Freq: Two times a day (BID) | CUTANEOUS | 1 refills | Status: DC | PRN

## 2020-11-13 DIAGNOSIS — E119 Type 2 diabetes mellitus without complications: Secondary | ICD-10-CM | POA: Diagnosis not present

## 2020-11-18 DIAGNOSIS — S335XXA Sprain of ligaments of lumbar spine, initial encounter: Secondary | ICD-10-CM | POA: Diagnosis not present

## 2020-11-18 DIAGNOSIS — S29012A Strain of muscle and tendon of back wall of thorax, initial encounter: Secondary | ICD-10-CM | POA: Diagnosis not present

## 2020-11-18 DIAGNOSIS — M9901 Segmental and somatic dysfunction of cervical region: Secondary | ICD-10-CM | POA: Diagnosis not present

## 2020-11-18 DIAGNOSIS — M9903 Segmental and somatic dysfunction of lumbar region: Secondary | ICD-10-CM | POA: Diagnosis not present

## 2020-11-18 DIAGNOSIS — S138XXA Sprain of joints and ligaments of other parts of neck, initial encounter: Secondary | ICD-10-CM | POA: Diagnosis not present

## 2020-11-18 DIAGNOSIS — M9902 Segmental and somatic dysfunction of thoracic region: Secondary | ICD-10-CM | POA: Diagnosis not present

## 2020-11-19 DIAGNOSIS — F419 Anxiety disorder, unspecified: Secondary | ICD-10-CM | POA: Diagnosis not present

## 2020-11-19 DIAGNOSIS — R030 Elevated blood-pressure reading, without diagnosis of hypertension: Secondary | ICD-10-CM | POA: Diagnosis not present

## 2020-12-03 DIAGNOSIS — L089 Local infection of the skin and subcutaneous tissue, unspecified: Secondary | ICD-10-CM | POA: Diagnosis not present

## 2020-12-03 DIAGNOSIS — L219 Seborrheic dermatitis, unspecified: Secondary | ICD-10-CM | POA: Diagnosis not present

## 2020-12-03 DIAGNOSIS — L249 Irritant contact dermatitis, unspecified cause: Secondary | ICD-10-CM | POA: Diagnosis not present

## 2020-12-03 DIAGNOSIS — L650 Telogen effluvium: Secondary | ICD-10-CM | POA: Diagnosis not present

## 2020-12-09 DIAGNOSIS — M21961 Unspecified acquired deformity of right lower leg: Secondary | ICD-10-CM | POA: Diagnosis not present

## 2020-12-09 DIAGNOSIS — I70203 Unspecified atherosclerosis of native arteries of extremities, bilateral legs: Secondary | ICD-10-CM | POA: Diagnosis not present

## 2020-12-09 DIAGNOSIS — M792 Neuralgia and neuritis, unspecified: Secondary | ICD-10-CM | POA: Diagnosis not present

## 2020-12-09 DIAGNOSIS — L603 Nail dystrophy: Secondary | ICD-10-CM | POA: Diagnosis not present

## 2020-12-09 DIAGNOSIS — L84 Corns and callosities: Secondary | ICD-10-CM | POA: Diagnosis not present

## 2020-12-09 DIAGNOSIS — G5762 Lesion of plantar nerve, left lower limb: Secondary | ICD-10-CM | POA: Diagnosis not present

## 2020-12-15 DIAGNOSIS — E119 Type 2 diabetes mellitus without complications: Secondary | ICD-10-CM | POA: Diagnosis not present

## 2020-12-23 DIAGNOSIS — E119 Type 2 diabetes mellitus without complications: Secondary | ICD-10-CM | POA: Diagnosis not present

## 2020-12-23 DIAGNOSIS — E7800 Pure hypercholesterolemia, unspecified: Secondary | ICD-10-CM | POA: Diagnosis not present

## 2020-12-23 DIAGNOSIS — I10 Essential (primary) hypertension: Secondary | ICD-10-CM | POA: Diagnosis not present

## 2020-12-23 DIAGNOSIS — M25572 Pain in left ankle and joints of left foot: Secondary | ICD-10-CM | POA: Diagnosis not present

## 2020-12-23 DIAGNOSIS — M25571 Pain in right ankle and joints of right foot: Secondary | ICD-10-CM | POA: Diagnosis not present

## 2020-12-30 DIAGNOSIS — M9903 Segmental and somatic dysfunction of lumbar region: Secondary | ICD-10-CM | POA: Diagnosis not present

## 2020-12-30 DIAGNOSIS — S138XXA Sprain of joints and ligaments of other parts of neck, initial encounter: Secondary | ICD-10-CM | POA: Diagnosis not present

## 2020-12-30 DIAGNOSIS — L718 Other rosacea: Secondary | ICD-10-CM | POA: Diagnosis not present

## 2020-12-30 DIAGNOSIS — L738 Other specified follicular disorders: Secondary | ICD-10-CM | POA: Diagnosis not present

## 2020-12-30 DIAGNOSIS — M9902 Segmental and somatic dysfunction of thoracic region: Secondary | ICD-10-CM | POA: Diagnosis not present

## 2020-12-30 DIAGNOSIS — M9901 Segmental and somatic dysfunction of cervical region: Secondary | ICD-10-CM | POA: Diagnosis not present

## 2020-12-30 DIAGNOSIS — S335XXA Sprain of ligaments of lumbar spine, initial encounter: Secondary | ICD-10-CM | POA: Diagnosis not present

## 2020-12-30 DIAGNOSIS — S29012A Strain of muscle and tendon of back wall of thorax, initial encounter: Secondary | ICD-10-CM | POA: Diagnosis not present

## 2021-01-07 DIAGNOSIS — E7800 Pure hypercholesterolemia, unspecified: Secondary | ICD-10-CM | POA: Diagnosis not present

## 2021-01-07 DIAGNOSIS — I10 Essential (primary) hypertension: Secondary | ICD-10-CM | POA: Diagnosis not present

## 2021-01-07 DIAGNOSIS — E119 Type 2 diabetes mellitus without complications: Secondary | ICD-10-CM | POA: Diagnosis not present

## 2021-01-07 DIAGNOSIS — M8580 Other specified disorders of bone density and structure, unspecified site: Secondary | ICD-10-CM | POA: Diagnosis not present

## 2021-01-12 DIAGNOSIS — G629 Polyneuropathy, unspecified: Secondary | ICD-10-CM | POA: Diagnosis not present

## 2021-01-12 DIAGNOSIS — M7582 Other shoulder lesions, left shoulder: Secondary | ICD-10-CM | POA: Diagnosis not present

## 2021-01-20 DIAGNOSIS — G629 Polyneuropathy, unspecified: Secondary | ICD-10-CM | POA: Diagnosis not present

## 2021-01-21 DIAGNOSIS — H10502 Unspecified blepharoconjunctivitis, left eye: Secondary | ICD-10-CM | POA: Diagnosis not present

## 2021-01-27 DIAGNOSIS — S29012A Strain of muscle and tendon of back wall of thorax, initial encounter: Secondary | ICD-10-CM | POA: Diagnosis not present

## 2021-01-27 DIAGNOSIS — M9902 Segmental and somatic dysfunction of thoracic region: Secondary | ICD-10-CM | POA: Diagnosis not present

## 2021-01-27 DIAGNOSIS — M9901 Segmental and somatic dysfunction of cervical region: Secondary | ICD-10-CM | POA: Diagnosis not present

## 2021-01-27 DIAGNOSIS — S138XXA Sprain of joints and ligaments of other parts of neck, initial encounter: Secondary | ICD-10-CM | POA: Diagnosis not present

## 2021-01-27 DIAGNOSIS — M9903 Segmental and somatic dysfunction of lumbar region: Secondary | ICD-10-CM | POA: Diagnosis not present

## 2021-01-27 DIAGNOSIS — S335XXA Sprain of ligaments of lumbar spine, initial encounter: Secondary | ICD-10-CM | POA: Diagnosis not present

## 2021-01-29 DIAGNOSIS — M9902 Segmental and somatic dysfunction of thoracic region: Secondary | ICD-10-CM | POA: Diagnosis not present

## 2021-01-29 DIAGNOSIS — S138XXA Sprain of joints and ligaments of other parts of neck, initial encounter: Secondary | ICD-10-CM | POA: Diagnosis not present

## 2021-01-29 DIAGNOSIS — M9901 Segmental and somatic dysfunction of cervical region: Secondary | ICD-10-CM | POA: Diagnosis not present

## 2021-01-29 DIAGNOSIS — M9903 Segmental and somatic dysfunction of lumbar region: Secondary | ICD-10-CM | POA: Diagnosis not present

## 2021-01-29 DIAGNOSIS — S29012A Strain of muscle and tendon of back wall of thorax, initial encounter: Secondary | ICD-10-CM | POA: Diagnosis not present

## 2021-01-29 DIAGNOSIS — S335XXA Sprain of ligaments of lumbar spine, initial encounter: Secondary | ICD-10-CM | POA: Diagnosis not present

## 2021-02-03 DIAGNOSIS — S29012A Strain of muscle and tendon of back wall of thorax, initial encounter: Secondary | ICD-10-CM | POA: Diagnosis not present

## 2021-02-03 DIAGNOSIS — M9903 Segmental and somatic dysfunction of lumbar region: Secondary | ICD-10-CM | POA: Diagnosis not present

## 2021-02-03 DIAGNOSIS — M9901 Segmental and somatic dysfunction of cervical region: Secondary | ICD-10-CM | POA: Diagnosis not present

## 2021-02-03 DIAGNOSIS — M9902 Segmental and somatic dysfunction of thoracic region: Secondary | ICD-10-CM | POA: Diagnosis not present

## 2021-02-03 DIAGNOSIS — S138XXA Sprain of joints and ligaments of other parts of neck, initial encounter: Secondary | ICD-10-CM | POA: Diagnosis not present

## 2021-02-03 DIAGNOSIS — S335XXA Sprain of ligaments of lumbar spine, initial encounter: Secondary | ICD-10-CM | POA: Diagnosis not present

## 2021-02-05 DIAGNOSIS — S138XXA Sprain of joints and ligaments of other parts of neck, initial encounter: Secondary | ICD-10-CM | POA: Diagnosis not present

## 2021-02-05 DIAGNOSIS — S29012A Strain of muscle and tendon of back wall of thorax, initial encounter: Secondary | ICD-10-CM | POA: Diagnosis not present

## 2021-02-05 DIAGNOSIS — M9901 Segmental and somatic dysfunction of cervical region: Secondary | ICD-10-CM | POA: Diagnosis not present

## 2021-02-05 DIAGNOSIS — M9903 Segmental and somatic dysfunction of lumbar region: Secondary | ICD-10-CM | POA: Diagnosis not present

## 2021-02-05 DIAGNOSIS — S335XXA Sprain of ligaments of lumbar spine, initial encounter: Secondary | ICD-10-CM | POA: Diagnosis not present

## 2021-02-05 DIAGNOSIS — M9902 Segmental and somatic dysfunction of thoracic region: Secondary | ICD-10-CM | POA: Diagnosis not present

## 2021-02-09 DIAGNOSIS — M9901 Segmental and somatic dysfunction of cervical region: Secondary | ICD-10-CM | POA: Diagnosis not present

## 2021-02-09 DIAGNOSIS — S335XXA Sprain of ligaments of lumbar spine, initial encounter: Secondary | ICD-10-CM | POA: Diagnosis not present

## 2021-02-09 DIAGNOSIS — M9902 Segmental and somatic dysfunction of thoracic region: Secondary | ICD-10-CM | POA: Diagnosis not present

## 2021-02-09 DIAGNOSIS — M9903 Segmental and somatic dysfunction of lumbar region: Secondary | ICD-10-CM | POA: Diagnosis not present

## 2021-02-09 DIAGNOSIS — S29012A Strain of muscle and tendon of back wall of thorax, initial encounter: Secondary | ICD-10-CM | POA: Diagnosis not present

## 2021-02-09 DIAGNOSIS — S138XXA Sprain of joints and ligaments of other parts of neck, initial encounter: Secondary | ICD-10-CM | POA: Diagnosis not present

## 2021-02-11 DIAGNOSIS — S335XXA Sprain of ligaments of lumbar spine, initial encounter: Secondary | ICD-10-CM | POA: Diagnosis not present

## 2021-02-11 DIAGNOSIS — S138XXA Sprain of joints and ligaments of other parts of neck, initial encounter: Secondary | ICD-10-CM | POA: Diagnosis not present

## 2021-02-11 DIAGNOSIS — M9902 Segmental and somatic dysfunction of thoracic region: Secondary | ICD-10-CM | POA: Diagnosis not present

## 2021-02-11 DIAGNOSIS — S29012A Strain of muscle and tendon of back wall of thorax, initial encounter: Secondary | ICD-10-CM | POA: Diagnosis not present

## 2021-02-11 DIAGNOSIS — M9901 Segmental and somatic dysfunction of cervical region: Secondary | ICD-10-CM | POA: Diagnosis not present

## 2021-02-11 DIAGNOSIS — M9903 Segmental and somatic dysfunction of lumbar region: Secondary | ICD-10-CM | POA: Diagnosis not present

## 2021-02-12 DIAGNOSIS — E119 Type 2 diabetes mellitus without complications: Secondary | ICD-10-CM | POA: Diagnosis not present

## 2021-02-16 DIAGNOSIS — M9902 Segmental and somatic dysfunction of thoracic region: Secondary | ICD-10-CM | POA: Diagnosis not present

## 2021-02-16 DIAGNOSIS — S29012A Strain of muscle and tendon of back wall of thorax, initial encounter: Secondary | ICD-10-CM | POA: Diagnosis not present

## 2021-02-16 DIAGNOSIS — S138XXA Sprain of joints and ligaments of other parts of neck, initial encounter: Secondary | ICD-10-CM | POA: Diagnosis not present

## 2021-02-16 DIAGNOSIS — M9901 Segmental and somatic dysfunction of cervical region: Secondary | ICD-10-CM | POA: Diagnosis not present

## 2021-02-16 DIAGNOSIS — M9903 Segmental and somatic dysfunction of lumbar region: Secondary | ICD-10-CM | POA: Diagnosis not present

## 2021-02-16 DIAGNOSIS — S335XXA Sprain of ligaments of lumbar spine, initial encounter: Secondary | ICD-10-CM | POA: Diagnosis not present

## 2021-02-18 DIAGNOSIS — B351 Tinea unguium: Secondary | ICD-10-CM | POA: Diagnosis not present

## 2021-02-18 DIAGNOSIS — L603 Nail dystrophy: Secondary | ICD-10-CM | POA: Diagnosis not present

## 2021-02-18 DIAGNOSIS — G629 Polyneuropathy, unspecified: Secondary | ICD-10-CM | POA: Diagnosis not present

## 2021-02-18 DIAGNOSIS — L84 Corns and callosities: Secondary | ICD-10-CM | POA: Diagnosis not present

## 2021-02-18 DIAGNOSIS — E1151 Type 2 diabetes mellitus with diabetic peripheral angiopathy without gangrene: Secondary | ICD-10-CM | POA: Diagnosis not present

## 2021-02-19 DIAGNOSIS — M9901 Segmental and somatic dysfunction of cervical region: Secondary | ICD-10-CM | POA: Diagnosis not present

## 2021-02-19 DIAGNOSIS — S29012A Strain of muscle and tendon of back wall of thorax, initial encounter: Secondary | ICD-10-CM | POA: Diagnosis not present

## 2021-02-19 DIAGNOSIS — M9902 Segmental and somatic dysfunction of thoracic region: Secondary | ICD-10-CM | POA: Diagnosis not present

## 2021-02-19 DIAGNOSIS — M9903 Segmental and somatic dysfunction of lumbar region: Secondary | ICD-10-CM | POA: Diagnosis not present

## 2021-02-19 DIAGNOSIS — S335XXA Sprain of ligaments of lumbar spine, initial encounter: Secondary | ICD-10-CM | POA: Diagnosis not present

## 2021-02-19 DIAGNOSIS — S138XXA Sprain of joints and ligaments of other parts of neck, initial encounter: Secondary | ICD-10-CM | POA: Diagnosis not present

## 2021-02-24 DIAGNOSIS — S335XXA Sprain of ligaments of lumbar spine, initial encounter: Secondary | ICD-10-CM | POA: Diagnosis not present

## 2021-02-24 DIAGNOSIS — M9903 Segmental and somatic dysfunction of lumbar region: Secondary | ICD-10-CM | POA: Diagnosis not present

## 2021-02-24 DIAGNOSIS — S29012A Strain of muscle and tendon of back wall of thorax, initial encounter: Secondary | ICD-10-CM | POA: Diagnosis not present

## 2021-02-24 DIAGNOSIS — M9901 Segmental and somatic dysfunction of cervical region: Secondary | ICD-10-CM | POA: Diagnosis not present

## 2021-02-24 DIAGNOSIS — M9902 Segmental and somatic dysfunction of thoracic region: Secondary | ICD-10-CM | POA: Diagnosis not present

## 2021-02-24 DIAGNOSIS — S138XXA Sprain of joints and ligaments of other parts of neck, initial encounter: Secondary | ICD-10-CM | POA: Diagnosis not present

## 2021-02-26 DIAGNOSIS — S138XXA Sprain of joints and ligaments of other parts of neck, initial encounter: Secondary | ICD-10-CM | POA: Diagnosis not present

## 2021-02-26 DIAGNOSIS — M9903 Segmental and somatic dysfunction of lumbar region: Secondary | ICD-10-CM | POA: Diagnosis not present

## 2021-02-26 DIAGNOSIS — M9901 Segmental and somatic dysfunction of cervical region: Secondary | ICD-10-CM | POA: Diagnosis not present

## 2021-02-26 DIAGNOSIS — S335XXA Sprain of ligaments of lumbar spine, initial encounter: Secondary | ICD-10-CM | POA: Diagnosis not present

## 2021-02-26 DIAGNOSIS — M9902 Segmental and somatic dysfunction of thoracic region: Secondary | ICD-10-CM | POA: Diagnosis not present

## 2021-02-26 DIAGNOSIS — S29012A Strain of muscle and tendon of back wall of thorax, initial encounter: Secondary | ICD-10-CM | POA: Diagnosis not present

## 2021-03-03 DIAGNOSIS — M9902 Segmental and somatic dysfunction of thoracic region: Secondary | ICD-10-CM | POA: Diagnosis not present

## 2021-03-03 DIAGNOSIS — S335XXA Sprain of ligaments of lumbar spine, initial encounter: Secondary | ICD-10-CM | POA: Diagnosis not present

## 2021-03-03 DIAGNOSIS — S29012A Strain of muscle and tendon of back wall of thorax, initial encounter: Secondary | ICD-10-CM | POA: Diagnosis not present

## 2021-03-03 DIAGNOSIS — M9903 Segmental and somatic dysfunction of lumbar region: Secondary | ICD-10-CM | POA: Diagnosis not present

## 2021-03-03 DIAGNOSIS — S138XXA Sprain of joints and ligaments of other parts of neck, initial encounter: Secondary | ICD-10-CM | POA: Diagnosis not present

## 2021-03-03 DIAGNOSIS — M9901 Segmental and somatic dysfunction of cervical region: Secondary | ICD-10-CM | POA: Diagnosis not present

## 2021-03-04 DIAGNOSIS — H524 Presbyopia: Secondary | ICD-10-CM | POA: Diagnosis not present

## 2021-03-04 DIAGNOSIS — E119 Type 2 diabetes mellitus without complications: Secondary | ICD-10-CM | POA: Diagnosis not present

## 2021-03-04 DIAGNOSIS — Z961 Presence of intraocular lens: Secondary | ICD-10-CM | POA: Diagnosis not present

## 2021-03-05 DIAGNOSIS — M9901 Segmental and somatic dysfunction of cervical region: Secondary | ICD-10-CM | POA: Diagnosis not present

## 2021-03-05 DIAGNOSIS — S29012A Strain of muscle and tendon of back wall of thorax, initial encounter: Secondary | ICD-10-CM | POA: Diagnosis not present

## 2021-03-05 DIAGNOSIS — S335XXA Sprain of ligaments of lumbar spine, initial encounter: Secondary | ICD-10-CM | POA: Diagnosis not present

## 2021-03-05 DIAGNOSIS — S138XXA Sprain of joints and ligaments of other parts of neck, initial encounter: Secondary | ICD-10-CM | POA: Diagnosis not present

## 2021-03-05 DIAGNOSIS — M9903 Segmental and somatic dysfunction of lumbar region: Secondary | ICD-10-CM | POA: Diagnosis not present

## 2021-03-05 DIAGNOSIS — M9902 Segmental and somatic dysfunction of thoracic region: Secondary | ICD-10-CM | POA: Diagnosis not present

## 2021-03-10 DIAGNOSIS — S29012A Strain of muscle and tendon of back wall of thorax, initial encounter: Secondary | ICD-10-CM | POA: Diagnosis not present

## 2021-03-10 DIAGNOSIS — M9901 Segmental and somatic dysfunction of cervical region: Secondary | ICD-10-CM | POA: Diagnosis not present

## 2021-03-10 DIAGNOSIS — S138XXA Sprain of joints and ligaments of other parts of neck, initial encounter: Secondary | ICD-10-CM | POA: Diagnosis not present

## 2021-03-10 DIAGNOSIS — M9903 Segmental and somatic dysfunction of lumbar region: Secondary | ICD-10-CM | POA: Diagnosis not present

## 2021-03-10 DIAGNOSIS — S335XXA Sprain of ligaments of lumbar spine, initial encounter: Secondary | ICD-10-CM | POA: Diagnosis not present

## 2021-03-10 DIAGNOSIS — M9902 Segmental and somatic dysfunction of thoracic region: Secondary | ICD-10-CM | POA: Diagnosis not present

## 2021-03-15 DIAGNOSIS — M8580 Other specified disorders of bone density and structure, unspecified site: Secondary | ICD-10-CM | POA: Diagnosis not present

## 2021-03-15 DIAGNOSIS — E119 Type 2 diabetes mellitus without complications: Secondary | ICD-10-CM | POA: Diagnosis not present

## 2021-03-15 DIAGNOSIS — E7800 Pure hypercholesterolemia, unspecified: Secondary | ICD-10-CM | POA: Diagnosis not present

## 2021-03-15 DIAGNOSIS — I10 Essential (primary) hypertension: Secondary | ICD-10-CM | POA: Diagnosis not present

## 2021-03-17 DIAGNOSIS — S335XXA Sprain of ligaments of lumbar spine, initial encounter: Secondary | ICD-10-CM | POA: Diagnosis not present

## 2021-03-17 DIAGNOSIS — S138XXA Sprain of joints and ligaments of other parts of neck, initial encounter: Secondary | ICD-10-CM | POA: Diagnosis not present

## 2021-03-17 DIAGNOSIS — M9901 Segmental and somatic dysfunction of cervical region: Secondary | ICD-10-CM | POA: Diagnosis not present

## 2021-03-17 DIAGNOSIS — E119 Type 2 diabetes mellitus without complications: Secondary | ICD-10-CM | POA: Diagnosis not present

## 2021-03-17 DIAGNOSIS — M9902 Segmental and somatic dysfunction of thoracic region: Secondary | ICD-10-CM | POA: Diagnosis not present

## 2021-03-17 DIAGNOSIS — M9903 Segmental and somatic dysfunction of lumbar region: Secondary | ICD-10-CM | POA: Diagnosis not present

## 2021-03-17 DIAGNOSIS — S29012A Strain of muscle and tendon of back wall of thorax, initial encounter: Secondary | ICD-10-CM | POA: Diagnosis not present

## 2021-03-31 DIAGNOSIS — M9902 Segmental and somatic dysfunction of thoracic region: Secondary | ICD-10-CM | POA: Diagnosis not present

## 2021-03-31 DIAGNOSIS — S29012A Strain of muscle and tendon of back wall of thorax, initial encounter: Secondary | ICD-10-CM | POA: Diagnosis not present

## 2021-03-31 DIAGNOSIS — S335XXA Sprain of ligaments of lumbar spine, initial encounter: Secondary | ICD-10-CM | POA: Diagnosis not present

## 2021-03-31 DIAGNOSIS — M9903 Segmental and somatic dysfunction of lumbar region: Secondary | ICD-10-CM | POA: Diagnosis not present

## 2021-03-31 DIAGNOSIS — S138XXA Sprain of joints and ligaments of other parts of neck, initial encounter: Secondary | ICD-10-CM | POA: Diagnosis not present

## 2021-03-31 DIAGNOSIS — M9901 Segmental and somatic dysfunction of cervical region: Secondary | ICD-10-CM | POA: Diagnosis not present

## 2021-04-14 DIAGNOSIS — M9901 Segmental and somatic dysfunction of cervical region: Secondary | ICD-10-CM | POA: Diagnosis not present

## 2021-04-14 DIAGNOSIS — M9903 Segmental and somatic dysfunction of lumbar region: Secondary | ICD-10-CM | POA: Diagnosis not present

## 2021-04-14 DIAGNOSIS — S138XXA Sprain of joints and ligaments of other parts of neck, initial encounter: Secondary | ICD-10-CM | POA: Diagnosis not present

## 2021-04-14 DIAGNOSIS — S335XXA Sprain of ligaments of lumbar spine, initial encounter: Secondary | ICD-10-CM | POA: Diagnosis not present

## 2021-04-14 DIAGNOSIS — S29012A Strain of muscle and tendon of back wall of thorax, initial encounter: Secondary | ICD-10-CM | POA: Diagnosis not present

## 2021-04-14 DIAGNOSIS — M9902 Segmental and somatic dysfunction of thoracic region: Secondary | ICD-10-CM | POA: Diagnosis not present

## 2021-04-15 DIAGNOSIS — E7800 Pure hypercholesterolemia, unspecified: Secondary | ICD-10-CM | POA: Diagnosis not present

## 2021-04-15 DIAGNOSIS — E119 Type 2 diabetes mellitus without complications: Secondary | ICD-10-CM | POA: Diagnosis not present

## 2021-05-06 DIAGNOSIS — D1801 Hemangioma of skin and subcutaneous tissue: Secondary | ICD-10-CM | POA: Diagnosis not present

## 2021-05-06 DIAGNOSIS — L820 Inflamed seborrheic keratosis: Secondary | ICD-10-CM | POA: Diagnosis not present

## 2021-05-06 DIAGNOSIS — D2371 Other benign neoplasm of skin of right lower limb, including hip: Secondary | ICD-10-CM | POA: Diagnosis not present

## 2021-05-06 DIAGNOSIS — L821 Other seborrheic keratosis: Secondary | ICD-10-CM | POA: Diagnosis not present

## 2021-05-06 DIAGNOSIS — L738 Other specified follicular disorders: Secondary | ICD-10-CM | POA: Diagnosis not present

## 2021-05-06 DIAGNOSIS — L814 Other melanin hyperpigmentation: Secondary | ICD-10-CM | POA: Diagnosis not present

## 2021-05-06 DIAGNOSIS — D2372 Other benign neoplasm of skin of left lower limb, including hip: Secondary | ICD-10-CM | POA: Diagnosis not present

## 2021-05-06 DIAGNOSIS — L659 Nonscarring hair loss, unspecified: Secondary | ICD-10-CM | POA: Diagnosis not present

## 2021-05-06 DIAGNOSIS — L720 Epidermal cyst: Secondary | ICD-10-CM | POA: Diagnosis not present

## 2021-05-12 DIAGNOSIS — S335XXA Sprain of ligaments of lumbar spine, initial encounter: Secondary | ICD-10-CM | POA: Diagnosis not present

## 2021-05-12 DIAGNOSIS — S29012A Strain of muscle and tendon of back wall of thorax, initial encounter: Secondary | ICD-10-CM | POA: Diagnosis not present

## 2021-05-12 DIAGNOSIS — M9901 Segmental and somatic dysfunction of cervical region: Secondary | ICD-10-CM | POA: Diagnosis not present

## 2021-05-12 DIAGNOSIS — M9902 Segmental and somatic dysfunction of thoracic region: Secondary | ICD-10-CM | POA: Diagnosis not present

## 2021-05-12 DIAGNOSIS — M9903 Segmental and somatic dysfunction of lumbar region: Secondary | ICD-10-CM | POA: Diagnosis not present

## 2021-05-12 DIAGNOSIS — S138XXA Sprain of joints and ligaments of other parts of neck, initial encounter: Secondary | ICD-10-CM | POA: Diagnosis not present

## 2021-05-18 DIAGNOSIS — E119 Type 2 diabetes mellitus without complications: Secondary | ICD-10-CM | POA: Diagnosis not present

## 2021-05-20 DIAGNOSIS — G629 Polyneuropathy, unspecified: Secondary | ICD-10-CM | POA: Diagnosis not present

## 2021-05-20 DIAGNOSIS — E1151 Type 2 diabetes mellitus with diabetic peripheral angiopathy without gangrene: Secondary | ICD-10-CM | POA: Diagnosis not present

## 2021-05-20 DIAGNOSIS — B351 Tinea unguium: Secondary | ICD-10-CM | POA: Diagnosis not present

## 2021-05-20 DIAGNOSIS — L84 Corns and callosities: Secondary | ICD-10-CM | POA: Diagnosis not present

## 2021-05-20 DIAGNOSIS — M7671 Peroneal tendinitis, right leg: Secondary | ICD-10-CM | POA: Diagnosis not present

## 2021-05-20 DIAGNOSIS — L603 Nail dystrophy: Secondary | ICD-10-CM | POA: Diagnosis not present

## 2021-05-20 DIAGNOSIS — M25872 Other specified joint disorders, left ankle and foot: Secondary | ICD-10-CM | POA: Diagnosis not present

## 2021-06-10 DIAGNOSIS — S335XXA Sprain of ligaments of lumbar spine, initial encounter: Secondary | ICD-10-CM | POA: Diagnosis not present

## 2021-06-10 DIAGNOSIS — S138XXA Sprain of joints and ligaments of other parts of neck, initial encounter: Secondary | ICD-10-CM | POA: Diagnosis not present

## 2021-06-10 DIAGNOSIS — M9903 Segmental and somatic dysfunction of lumbar region: Secondary | ICD-10-CM | POA: Diagnosis not present

## 2021-06-10 DIAGNOSIS — M9901 Segmental and somatic dysfunction of cervical region: Secondary | ICD-10-CM | POA: Diagnosis not present

## 2021-06-10 DIAGNOSIS — S29012A Strain of muscle and tendon of back wall of thorax, initial encounter: Secondary | ICD-10-CM | POA: Diagnosis not present

## 2021-06-10 DIAGNOSIS — M9902 Segmental and somatic dysfunction of thoracic region: Secondary | ICD-10-CM | POA: Diagnosis not present

## 2021-06-15 DIAGNOSIS — E119 Type 2 diabetes mellitus without complications: Secondary | ICD-10-CM | POA: Diagnosis not present

## 2021-06-29 DIAGNOSIS — E559 Vitamin D deficiency, unspecified: Secondary | ICD-10-CM | POA: Diagnosis not present

## 2021-06-29 DIAGNOSIS — Z0000 Encounter for general adult medical examination without abnormal findings: Secondary | ICD-10-CM | POA: Diagnosis not present

## 2021-06-29 DIAGNOSIS — J309 Allergic rhinitis, unspecified: Secondary | ICD-10-CM | POA: Diagnosis not present

## 2021-06-29 DIAGNOSIS — I10 Essential (primary) hypertension: Secondary | ICD-10-CM | POA: Diagnosis not present

## 2021-06-29 DIAGNOSIS — Z79899 Other long term (current) drug therapy: Secondary | ICD-10-CM | POA: Diagnosis not present

## 2021-06-29 DIAGNOSIS — Z9109 Other allergy status, other than to drugs and biological substances: Secondary | ICD-10-CM | POA: Diagnosis not present

## 2021-06-29 DIAGNOSIS — E7800 Pure hypercholesterolemia, unspecified: Secondary | ICD-10-CM | POA: Diagnosis not present

## 2021-06-29 DIAGNOSIS — R600 Localized edema: Secondary | ICD-10-CM | POA: Diagnosis not present

## 2021-06-29 DIAGNOSIS — L659 Nonscarring hair loss, unspecified: Secondary | ICD-10-CM | POA: Diagnosis not present

## 2021-06-29 DIAGNOSIS — E119 Type 2 diabetes mellitus without complications: Secondary | ICD-10-CM | POA: Diagnosis not present

## 2021-07-05 DIAGNOSIS — E119 Type 2 diabetes mellitus without complications: Secondary | ICD-10-CM | POA: Diagnosis not present

## 2021-07-05 DIAGNOSIS — E7800 Pure hypercholesterolemia, unspecified: Secondary | ICD-10-CM | POA: Diagnosis not present

## 2021-07-05 DIAGNOSIS — I10 Essential (primary) hypertension: Secondary | ICD-10-CM | POA: Diagnosis not present

## 2021-07-07 DIAGNOSIS — L218 Other seborrheic dermatitis: Secondary | ICD-10-CM | POA: Diagnosis not present

## 2021-07-07 DIAGNOSIS — L718 Other rosacea: Secondary | ICD-10-CM | POA: Diagnosis not present

## 2021-07-13 DIAGNOSIS — E1151 Type 2 diabetes mellitus with diabetic peripheral angiopathy without gangrene: Secondary | ICD-10-CM | POA: Diagnosis not present

## 2021-07-13 DIAGNOSIS — M7671 Peroneal tendinitis, right leg: Secondary | ICD-10-CM | POA: Diagnosis not present

## 2021-07-13 DIAGNOSIS — M25872 Other specified joint disorders, left ankle and foot: Secondary | ICD-10-CM | POA: Diagnosis not present

## 2021-07-15 DIAGNOSIS — S29012A Strain of muscle and tendon of back wall of thorax, initial encounter: Secondary | ICD-10-CM | POA: Diagnosis not present

## 2021-07-15 DIAGNOSIS — S335XXA Sprain of ligaments of lumbar spine, initial encounter: Secondary | ICD-10-CM | POA: Diagnosis not present

## 2021-07-15 DIAGNOSIS — M9901 Segmental and somatic dysfunction of cervical region: Secondary | ICD-10-CM | POA: Diagnosis not present

## 2021-07-15 DIAGNOSIS — S138XXA Sprain of joints and ligaments of other parts of neck, initial encounter: Secondary | ICD-10-CM | POA: Diagnosis not present

## 2021-07-15 DIAGNOSIS — M9903 Segmental and somatic dysfunction of lumbar region: Secondary | ICD-10-CM | POA: Diagnosis not present

## 2021-07-15 DIAGNOSIS — M9902 Segmental and somatic dysfunction of thoracic region: Secondary | ICD-10-CM | POA: Diagnosis not present

## 2021-07-16 DIAGNOSIS — E119 Type 2 diabetes mellitus without complications: Secondary | ICD-10-CM | POA: Diagnosis not present

## 2021-07-22 DIAGNOSIS — L603 Nail dystrophy: Secondary | ICD-10-CM | POA: Diagnosis not present

## 2021-07-22 DIAGNOSIS — M205X1 Other deformities of toe(s) (acquired), right foot: Secondary | ICD-10-CM | POA: Diagnosis not present

## 2021-07-22 DIAGNOSIS — M7671 Peroneal tendinitis, right leg: Secondary | ICD-10-CM | POA: Diagnosis not present

## 2021-07-22 DIAGNOSIS — M205X2 Other deformities of toe(s) (acquired), left foot: Secondary | ICD-10-CM | POA: Diagnosis not present

## 2021-07-22 DIAGNOSIS — E1151 Type 2 diabetes mellitus with diabetic peripheral angiopathy without gangrene: Secondary | ICD-10-CM | POA: Diagnosis not present

## 2021-07-22 DIAGNOSIS — G629 Polyneuropathy, unspecified: Secondary | ICD-10-CM | POA: Diagnosis not present

## 2021-07-22 DIAGNOSIS — L84 Corns and callosities: Secondary | ICD-10-CM | POA: Diagnosis not present

## 2021-07-22 DIAGNOSIS — M25872 Other specified joint disorders, left ankle and foot: Secondary | ICD-10-CM | POA: Diagnosis not present

## 2021-07-22 DIAGNOSIS — B351 Tinea unguium: Secondary | ICD-10-CM | POA: Diagnosis not present

## 2021-08-03 ENCOUNTER — Encounter: Payer: Self-pay | Admitting: Gastroenterology

## 2021-08-15 DIAGNOSIS — E119 Type 2 diabetes mellitus without complications: Secondary | ICD-10-CM | POA: Diagnosis not present

## 2021-08-16 DIAGNOSIS — M9902 Segmental and somatic dysfunction of thoracic region: Secondary | ICD-10-CM | POA: Diagnosis not present

## 2021-08-16 DIAGNOSIS — S138XXA Sprain of joints and ligaments of other parts of neck, initial encounter: Secondary | ICD-10-CM | POA: Diagnosis not present

## 2021-08-16 DIAGNOSIS — M9901 Segmental and somatic dysfunction of cervical region: Secondary | ICD-10-CM | POA: Diagnosis not present

## 2021-08-16 DIAGNOSIS — S29012A Strain of muscle and tendon of back wall of thorax, initial encounter: Secondary | ICD-10-CM | POA: Diagnosis not present

## 2021-08-16 DIAGNOSIS — M9903 Segmental and somatic dysfunction of lumbar region: Secondary | ICD-10-CM | POA: Diagnosis not present

## 2021-08-16 DIAGNOSIS — S335XXA Sprain of ligaments of lumbar spine, initial encounter: Secondary | ICD-10-CM | POA: Diagnosis not present

## 2021-08-20 DIAGNOSIS — R42 Dizziness and giddiness: Secondary | ICD-10-CM | POA: Diagnosis not present

## 2021-08-20 DIAGNOSIS — Z9109 Other allergy status, other than to drugs and biological substances: Secondary | ICD-10-CM | POA: Diagnosis not present

## 2021-08-20 DIAGNOSIS — H6123 Impacted cerumen, bilateral: Secondary | ICD-10-CM | POA: Diagnosis not present

## 2021-08-20 DIAGNOSIS — J019 Acute sinusitis, unspecified: Secondary | ICD-10-CM | POA: Diagnosis not present

## 2021-08-20 DIAGNOSIS — J309 Allergic rhinitis, unspecified: Secondary | ICD-10-CM | POA: Diagnosis not present

## 2021-08-26 DIAGNOSIS — B351 Tinea unguium: Secondary | ICD-10-CM | POA: Diagnosis not present

## 2021-08-26 DIAGNOSIS — L84 Corns and callosities: Secondary | ICD-10-CM | POA: Diagnosis not present

## 2021-08-26 DIAGNOSIS — E1151 Type 2 diabetes mellitus with diabetic peripheral angiopathy without gangrene: Secondary | ICD-10-CM | POA: Diagnosis not present

## 2021-08-26 DIAGNOSIS — M7671 Peroneal tendinitis, right leg: Secondary | ICD-10-CM | POA: Diagnosis not present

## 2021-08-26 DIAGNOSIS — M25872 Other specified joint disorders, left ankle and foot: Secondary | ICD-10-CM | POA: Diagnosis not present

## 2021-08-26 DIAGNOSIS — L603 Nail dystrophy: Secondary | ICD-10-CM | POA: Diagnosis not present

## 2021-08-26 DIAGNOSIS — G629 Polyneuropathy, unspecified: Secondary | ICD-10-CM | POA: Diagnosis not present

## 2021-08-30 ENCOUNTER — Ambulatory Visit (AMBULATORY_SURGERY_CENTER): Payer: Self-pay | Admitting: *Deleted

## 2021-08-30 VITALS — Ht 67.5 in | Wt 169.2 lb

## 2021-08-30 DIAGNOSIS — Z86010 Personal history of colonic polyps: Secondary | ICD-10-CM

## 2021-08-30 MED ORDER — PEG 3350-KCL-NA BICARB-NACL 420 G PO SOLR: 4000.0000 mL | Freq: Once | ORAL | 0 refills | Status: AC

## 2021-08-30 NOTE — Progress Notes (Signed)
No egg or soy allergy known to patient  ?No issues known to pt with past sedation with any surgeries or procedures ?Patient denies ever being told they had issues or difficulty with intubation  ?No FH of Malignant Hyperthermia ?Pt is not on diet pills ?Pt is not on  home 02  ?Pt is not on blood thinners  ?Pt has some issues with constipation, encouraged to take Miralax daily prior to procedure once she is holding her fiber products for the 5 days prior to colon. ?No A fib or A flutter ? ? ?PV completed in person. Pt verified name, DOB, address and insurance during PV today.  ?Pt given instruction packet with copy of consent form to read and sign. ?Pt encouraged to call with questions or issues.  ? ?Insurance confirmed with pt at [facility] today   ?

## 2021-09-15 DIAGNOSIS — E119 Type 2 diabetes mellitus without complications: Secondary | ICD-10-CM | POA: Diagnosis not present

## 2021-09-20 DIAGNOSIS — L509 Urticaria, unspecified: Secondary | ICD-10-CM | POA: Diagnosis not present

## 2021-09-24 DIAGNOSIS — M9903 Segmental and somatic dysfunction of lumbar region: Secondary | ICD-10-CM | POA: Diagnosis not present

## 2021-09-24 DIAGNOSIS — M9902 Segmental and somatic dysfunction of thoracic region: Secondary | ICD-10-CM | POA: Diagnosis not present

## 2021-09-24 DIAGNOSIS — S138XXA Sprain of joints and ligaments of other parts of neck, initial encounter: Secondary | ICD-10-CM | POA: Diagnosis not present

## 2021-09-24 DIAGNOSIS — S335XXA Sprain of ligaments of lumbar spine, initial encounter: Secondary | ICD-10-CM | POA: Diagnosis not present

## 2021-09-24 DIAGNOSIS — S29012A Strain of muscle and tendon of back wall of thorax, initial encounter: Secondary | ICD-10-CM | POA: Diagnosis not present

## 2021-09-24 DIAGNOSIS — M9901 Segmental and somatic dysfunction of cervical region: Secondary | ICD-10-CM | POA: Diagnosis not present

## 2021-09-29 ENCOUNTER — Telehealth: Payer: Self-pay | Admitting: Gastroenterology

## 2021-09-29 NOTE — Telephone Encounter (Signed)
Inbound call from patient inquiring if it is ok for her to drink water and vinegar and also colegen peptide mixed with water up until Sunday. Patient is scheduled for a procedure 6/19.Please give patient a call back to advise.  Thank you

## 2021-09-29 NOTE — Telephone Encounter (Signed)
YES- THESE THINGS ARE OKAY TO CONTINUE WITH NO ISSUES- PT VERBALIZED UNDERSTANDING

## 2021-09-30 ENCOUNTER — Encounter: Payer: Self-pay | Admitting: Gastroenterology

## 2021-10-04 ENCOUNTER — Encounter: Payer: Self-pay | Admitting: Gastroenterology

## 2021-10-04 ENCOUNTER — Ambulatory Visit (AMBULATORY_SURGERY_CENTER): Payer: Medicare HMO | Admitting: Gastroenterology

## 2021-10-04 VITALS — BP 144/84 | HR 74 | Temp 98.4°F | Resp 18 | Ht 67.0 in | Wt 169.2 lb

## 2021-10-04 DIAGNOSIS — Z86010 Personal history of colonic polyps: Secondary | ICD-10-CM | POA: Diagnosis not present

## 2021-10-04 DIAGNOSIS — Z8371 Family history of colonic polyps: Secondary | ICD-10-CM | POA: Diagnosis not present

## 2021-10-04 DIAGNOSIS — Z09 Encounter for follow-up examination after completed treatment for conditions other than malignant neoplasm: Secondary | ICD-10-CM

## 2021-10-04 DIAGNOSIS — D125 Benign neoplasm of sigmoid colon: Secondary | ICD-10-CM | POA: Diagnosis not present

## 2021-10-04 DIAGNOSIS — D122 Benign neoplasm of ascending colon: Secondary | ICD-10-CM

## 2021-10-04 MED ORDER — SODIUM CHLORIDE 0.9 % IV SOLN: 500.0000 mL | Freq: Once | INTRAVENOUS | Status: DC

## 2021-10-04 NOTE — Progress Notes (Signed)
History & Physical  Primary Care Physician:  Shirline Frees, MD Primary Gastroenterologist: Lucio Edward, MD  CHIEF COMPLAINT: Family history of colon polyps, Personal history of colon polyps   HPI: Kelly Quinn is a 76 y.o. female with a personal history of adenomatous colon polyps and a family history with multiple first-degree relatives with colon polyps for colonoscopy.   Past Medical History:  Diagnosis Date   Acid reflux    Allergy    Arthritis    Bil knees   Hypertension    Osteoporosis 08/2017   T score -2.2 prior 2017 T score -2.6    Post-operative nausea and vomiting     Past Surgical History:  Procedure Laterality Date   CATARACT EXTRACTION, BILATERAL     COLONOSCOPY     DIAGNOSTIC LAPAROSCOPY  11/2015   Pt unsure of this surg!/torn cartilage in left knee   ELBOW SURGERY     right elbow   KNEE SURGERY     Arthroscopic   SALIVARY STONE REMOVAL  09/2012   wart removed under eye      Prior to Admission medications   Medication Sig Start Date End Date Taking? Authorizing Provider  Accu-Chek Softclix Lancets lancets SMARTSIG:Topical 04/01/21  Yes [provider]  Alcohol Swabs (DROPSAFE ALCOHOL PREP) 70 % PADS Apply topically. 05/07/21  Yes [provider]  betamethasone valerate ointment (VALISONE) 0.1 % APPLY TO AFFECTED AREA TWICE A DAY 02/04/20  Yes Marny Lowenstein A, NP  calcium gluconate 500 MG tablet Take 3 tablets by mouth daily.   Yes [provider]  Calcium-Phosphorus-Vitamin D (CALCIUM GUMMIES PO) Take 1,500 mg by mouth daily.   Yes [provider]  cetirizine (ZYRTEC) 10 MG tablet Take 10 mg by mouth as needed.    Yes [provider]  Cholecalciferol (VITAMIN D PO) Take by mouth. Take 2 gummies daily.   Yes [provider]  Collagen Hydrolysate POWD by Misc.(Non-Drug; Combo Route) route.   Yes [provider]  COLLAGEN PO Take 1 tablet by mouth daily.   Yes [provider]   Cyanocobalamin (VITAMIN B 12 PO) Take 1 tablet by mouth. Take 2 gummies daily   Yes [provider]  cyclobenzaprine (FLEXERIL) 10 MG tablet Oral 02/01/16  Yes [provider]  famciclovir (FAMVIR) 125 MG tablet Take 125 mg by mouth as needed.     Yes [provider]  famciclovir (FAMVIR) 500 MG tablet Oral 02/01/16  Yes [provider]  finasteride (PROSCAR) 5 MG tablet Take by mouth. 06/20/21  Yes [provider]  fluticasone (FLONASE) 50 MCG/ACT nasal spray 2 sprays by Nasal route as needed.     Yes [provider]  hydrochlorothiazide (HYDRODIURIL) 12.5 MG tablet Take 12.5 mg by mouth as needed.   Yes [provider]  metFORMIN (GLUCOPHAGE) 500 MG tablet Take by mouth daily.   Yes [provider]  NON FORMULARY Vita fusion MVI gummie-Take 2 daily   Yes [provider]  NONFORMULARY OR COMPOUNDED ITEM Boric Acid 600 mg. Capsules #30 s: insert caps vaginally twice weekly. Patient taking differently: Boric Acid 600 mg. Capsules #30 s: insert caps vaginally prn 01/06/16  Yes Huel Cote, NP  rosuvastatin (CRESTOR) 10 MG tablet Take by mouth. 04/16/21  Yes [provider]  EPINEPHrine 0.3 mg/0.3 mL IJ SOAJ injection Injection 02/01/16   [provider]  LORazepam (ATIVAN) 0.5 MG tablet Take 0.5 mg by mouth daily as needed. Patient not taking: Reported  on 10/04/2021 09/22/21   [provider]  triamcinolone cream (KENALOG) 0.1 % Apply topically 2 (two) times daily as needed. Patient not taking: Reported on 10/04/2021 11/12/20   Tamela Gammon, NP    Current Outpatient Medications  Medication Sig Dispense Refill   Accu-Chek Softclix Lancets lancets SMARTSIG:Topical     Alcohol Swabs (DROPSAFE ALCOHOL PREP) 70 % PADS Apply topically.     betamethasone valerate ointment (VALISONE) 0.1 % APPLY TO AFFECTED AREA TWICE A DAY 45 g 1   calcium gluconate 500 MG tablet Take 3 tablets by mouth  daily.     Calcium-Phosphorus-Vitamin D (CALCIUM GUMMIES PO) Take 1,500 mg by mouth daily.     cetirizine (ZYRTEC) 10 MG tablet Take 10 mg by mouth as needed.      Cholecalciferol (VITAMIN D PO) Take by mouth. Take 2 gummies daily.     Collagen Hydrolysate POWD by Misc.(Non-Drug; Combo Route) route.     COLLAGEN PO Take 1 tablet by mouth daily.     Cyanocobalamin (VITAMIN B 12 PO) Take 1 tablet by mouth. Take 2 gummies daily     cyclobenzaprine (FLEXERIL) 10 MG tablet Oral     famciclovir (FAMVIR) 125 MG tablet Take 125 mg by mouth as needed.       famciclovir (FAMVIR) 500 MG tablet Oral     finasteride (PROSCAR) 5 MG tablet Take by mouth.     fluticasone (FLONASE) 50 MCG/ACT nasal spray 2 sprays by Nasal route as needed.       hydrochlorothiazide (HYDRODIURIL) 12.5 MG tablet Take 12.5 mg by mouth as needed.     metFORMIN (GLUCOPHAGE) 500 MG tablet Take by mouth daily.     NON FORMULARY Vita fusion MVI gummie-Take 2 daily     NONFORMULARY OR COMPOUNDED ITEM Boric Acid 600 mg. Capsules #30 s: insert caps vaginally twice weekly. (Patient taking differently: Boric Acid 600 mg. Capsules #30 s: insert caps vaginally prn) 30 each 1   rosuvastatin (CRESTOR) 10 MG tablet Take by mouth.     EPINEPHrine 0.3 mg/0.3 mL IJ SOAJ injection Injection     LORazepam (ATIVAN) 0.5 MG tablet Take 0.5 mg by mouth daily as needed. (Patient not taking: Reported on 10/04/2021)     triamcinolone cream (KENALOG) 0.1 % Apply topically 2 (two) times daily as needed. (Patient not taking: Reported on 10/04/2021) 30 g 1   Current Facility-Administered Medications  Medication Dose Route Frequency Provider Last Rate Last Admin   0.9 %  sodium chloride infusion  500 mL Intravenous Once Ladene Artist, MD        Allergies as of 10/04/2021 - Review Complete 10/04/2021  Allergen Reaction Noted   Asa [aspirin]  10/17/2014   Antiseptic products, misc.  09/13/2016   Codeine  08/24/2010   Darvon  08/24/2010   Levofloxacin  Hives 08/24/2010   Other Rash 08/30/2021    Family History  Problem Relation Age of Onset   Hypertension Mother    Hypertension Sister    Lymphoma Sister    Colon polyps Sister    Breast cancer Sister    Colon polyps Brother    Diabetes Brother    Kidney disease Brother    Diabetes Brother    Colon cancer Neg Hx    Esophageal cancer Neg Hx    Stomach cancer Neg Hx    Rectal cancer Neg Hx     Social History   Socioeconomic History   Marital status: Widowed    Spouse name:  Not on file   Number of children: Not on file   Years of education: Not on file   Highest education level: Not on file  Occupational History   Not on file  Tobacco Use   Smoking status: Never   Smokeless tobacco: Never  Vaping Use   Vaping Use: Never used  Substance and Sexual Activity   Alcohol use: No    Alcohol/week: 0.0 standard drinks of alcohol   Drug use: No   Sexual activity: Not Currently    Comment: 1st intercourse 76 yo-Fewer than 5 partners  Other Topics Concern   Not on file  Social History Narrative   Not on file   Social Determinants of Health   Financial Resource Strain: Not on file  Food Insecurity: Not on file  Transportation Needs: Not on file  Physical Activity: Not on file  Stress: Not on file  Social Connections: Not on file  Intimate Partner Violence: Not on file    Review of Systems:  All systems reviewed an negative except where noted in HPI.  Gen: Denies any fever, chills, sweats, anorexia, fatigue, weakness, malaise, weight loss, and sleep disorder CV: Denies chest pain, angina, palpitations, syncope, orthopnea, PND, peripheral edema, and claudication. Resp: Denies dyspnea at rest, dyspnea with exercise, cough, sputum, wheezing, coughing up blood, and pleurisy. GI: Denies vomiting blood, jaundice, and fecal incontinence.   Denies dysphagia or odynophagia. GU : Denies urinary burning, blood in urine, urinary frequency, urinary hesitancy, nocturnal urination,  and urinary incontinence. MS: Denies joint pain, limitation of movement, and swelling, stiffness, low back pain, extremity pain. Denies muscle weakness, cramps, atrophy.  Derm: Denies rash, itching, dry skin, hives, moles, warts, or unhealing ulcers.  Psych: Denies depression, anxiety, memory loss, suicidal ideation, hallucinations, paranoia, and confusion. Heme: Denies bruising, bleeding, and enlarged lymph nodes. Neuro:  Denies any headaches, dizziness, paresthesias. Endo:  Denies any problems with DM, thyroid, adrenal function.   Physical Exam: General:  Alert, well-developed, in NAD Head:  Normocephalic and atraumatic. Eyes:  Sclera clear, no icterus.   Conjunctiva pink. Ears:  Normal auditory acuity. Mouth:  No deformity or lesions.  Neck:  Supple; no masses . Lungs:  Clear throughout to auscultation.   No wheezes, crackles, or rhonchi. No acute distress. Heart:  Regular rate and rhythm; no murmurs. Abdomen:  Soft, nondistended, nontender. No masses, hepatomegaly. No obvious masses.  Normal bowel .    Rectal:  Deferred   Msk:  Symmetrical without gross deformities.. Pulses:  Normal pulses noted. Extremities:  Without edema. Neurologic:  Alert and  oriented x4;  grossly normal neurologically. Skin:  Intact without significant lesions or rashes. Cervical Nodes:  No significant cervical adenopathy. Psych:  Alert and cooperative. Normal mood and affect.  Impression / Plan:   Personal history of adenomatous colon polyps and a family history with multiple first-degree relatives with colon polyps for colonoscopy.  Pricilla Riffle. Fuller Plan  10/04/2021, 8:20 AM See Shea Evans, [facility], to contact our on call provider

## 2021-10-04 NOTE — Patient Instructions (Signed)
Handout given about polyps.  Await pathology report. Resume previous diet.  Continue present medications.  YOU HAD AN ENDOSCOPIC PROCEDURE TODAY AT [facility]:   Refer to the procedure report that was given to you for any specific questions about what was found during the examination.  If the procedure report does not answer your questions, please call your gastroenterologist to clarify.  If you requested that your care partner not be given the details of your procedure findings, then the procedure report has been included in a sealed envelope for you to review at your convenience later.  YOU SHOULD EXPECT: Some feelings of bloating in the abdomen. Passage of more gas than usual.  Walking can help get rid of the air that was put into your GI tract during the procedure and reduce the bloating. If you had a lower endoscopy (such as a colonoscopy or flexible sigmoidoscopy) you may notice spotting of blood in your stool or on the toilet paper. If you underwent a bowel prep for your procedure, you may not have a normal bowel movement for a few days.  Please Note:  You might notice some irritation and congestion in your nose or some drainage.  This is from the oxygen used during your procedure.  There is no need for concern and it should clear up in a day or so.  SYMPTOMS TO REPORT IMMEDIATELY:  Following lower endoscopy (colonoscopy or flexible sigmoidoscopy):  Excessive amounts of blood in the stool  Significant tenderness or worsening of abdominal pains  Swelling of the abdomen that is new, acute  Fever of 100F or higher  For urgent or emergent issues, a gastroenterologist can be reached at any hour by calling [phone]. Do not use MyChart messaging for urgent concerns.    DIET:  We do recommend a small meal at first, but then you may proceed to your regular diet.  Drink plenty of fluids but you should avoid alcoholic beverages for 24 hours.  ACTIVITY:  You should plan to  take it easy for the rest of today and you should NOT DRIVE or use heavy machinery until tomorrow (because of the sedation medicines used during the test).    FOLLOW UP: Our staff will call the number listed on your records 24-72 hours following your procedure to check on you and address any questions or concerns that you may have regarding the information given to you following your procedure. If we do not reach you, we will leave a message.  We will attempt to reach you two times.  During this call, we will ask if you have developed any symptoms of COVID 19. If you develop any symptoms (ie: fever, flu-like symptoms, shortness of breath, cough etc.) before then, please call [phone].  If you test positive for Covid 19 in the 2 weeks post procedure, please call and report this information to [place].    If any biopsies were taken you will be contacted by phone or by letter within the next 1-3 weeks.  Please call us at [phone] if you have not heard about the biopsies in 3 weeks.    SIGNATURES/CONFIDENTIALITY: You and/or your care partner have signed paperwork which will be entered into your electronic medical record.  These signatures attest to the fact that that the information above on your After Visit Summary has been reviewed and is understood.  Full responsibility of the confidentiality of this discharge information lies with you and/or your care-partner.

## 2021-10-04 NOTE — Op Note (Signed)
[facility] Patient Name: Kelly Quinn Procedure Date: 10/04/2021 8:10 AM MRN: [id number] Endoscopist: Ladene Artist , MD Age: 76 Referring MD:  Date of Birth: [birth year] Gender: Female Account #: [id number] Procedure:                Colonoscopy Indications:              Surveillance: Personal history of adenomatous                            polyps on last colonoscopy 5 years ago. Personal                            history of adenomatous colon polyps. Medicines:                Monitored Anesthesia Care Procedure:                Pre-Anesthesia Assessment:                           - Prior to the procedure, a History and Physical                            was performed, and patient medications and                            allergies were reviewed. The patient's tolerance of                            previous anesthesia was also reviewed. The risks                            and benefits of the procedure and the sedation                            options and risks were discussed with the patient.                            All questions were answered, and informed consent                            was obtained. Prior Anticoagulants: The patient has                            taken no previous anticoagulant or antiplatelet                            agents. ASA Grade Assessment: II - A patient with                            mild systemic disease. After reviewing the risks                            and benefits, the patient was deemed in  satisfactory condition to undergo the procedure.                           After obtaining informed consent, the colonoscope                            was passed under direct vision. Throughout the                            procedure, the patient's blood pressure, pulse, and                            oxygen saturations were monitored continuously. The                            Olympus PCF-H190DL  (#[id number]) Colonoscope was                            introduced through the anus and advanced to the the                            cecum, identified by appendiceal orifice and                            ileocecal valve. The ileocecal valve, appendiceal                            orifice, and rectum were photographed. The quality                            of the bowel preparation was good. The colonoscopy                            was somewhat difficult due to significant looping                            and a tortuous colon. Successful completion of the                            procedure was aided by using manual pressure,                            withdrawing and reinserting the scope,                            straightening and shortening the scope to obtain                            bowel loop reduction and using scope torsion. The                            patient tolerated the procedure well. Scope In: 8:29:14 AM Scope Out: 8:59:05 AM Scope Withdrawal Time: 0 hours 15 minutes 4 seconds  Total Procedure Duration:  0 hours 29 minutes 51 seconds  Findings:                 The perianal and digital rectal examinations were                            normal.                           Three sessile polyps were found in the sigmoid                            colon (1) and ascending colon (2). The polyps were                            6 to 8 mm in size. These polyps were removed with a                            cold snare. Resection and retrieval were complete.                           Internal hemorrhoids were found during                            retroflexion. The hemorrhoids were small and Grade                            I (internal hemorrhoids that do not prolapse).                           The exam was otherwise without abnormality on                            direct and retroflexion views. Complications:            No immediate complications. Estimated blood loss:                             None. Estimated Blood Loss:     Estimated blood loss: none. Impression:               - Three 6 to 8 mm polyps in the sigmoid colon and                            in the ascending colon, removed with a cold snare.                            Resected and retrieved.                           - Internal hemorrhoids.                           - The examination was otherwise normal on direct  and retroflexion views. Recommendation:           - Repeat colonoscopy vs no repeat due to age after                            studies are complete for surveillance based on                            pathology results.                           - Patient has a contact number available for                            emergencies. The signs and symptoms of potential                            delayed complications were discussed with the                            patient. Return to normal activities tomorrow.                            Written discharge instructions were provided to the                            patient.                           - Resume previous diet.                           - Continue present medications.                           - Await pathology results. Ladene Artist, MD 10/04/2021 9:04:11 AM This report has been signed electronically.

## 2021-10-04 NOTE — Progress Notes (Signed)
Pt's states no medical or surgical changes since previsit or office visit. 

## 2021-10-04 NOTE — Progress Notes (Signed)
Called to room to assist during endoscopic procedure.  Patient ID and intended procedure confirmed with present staff. Received instructions for my participation in the procedure from the performing physician.  

## 2021-10-04 NOTE — Progress Notes (Signed)
A/ox3, pleased with MAC, report to RN 

## 2021-10-05 ENCOUNTER — Telehealth: Payer: Self-pay

## 2021-10-05 NOTE — Telephone Encounter (Signed)
  Follow up Call-     10/04/2021    7:36 AM  Call back number  Post procedure Call Back phone  # [phone]  Permission to leave phone message Yes     Patient questions:  Do you have a fever, pain , or abdominal swelling? No. Pain Score  0 *  Have you tolerated food without any problems? Yes.    Have you been able to return to your normal activities? Yes.    Do you have any questions about your discharge instructions: Diet   No. Medications  No. Follow up visit  No.  Do you have questions or concerns about your Care? No.  Actions: * If pain score is 4 or above: No action needed, pain <4.

## 2021-10-07 DIAGNOSIS — Z1231 Encounter for screening mammogram for malignant neoplasm of breast: Secondary | ICD-10-CM | POA: Diagnosis not present

## 2021-10-12 ENCOUNTER — Encounter: Payer: Self-pay | Admitting: Gastroenterology

## 2021-10-14 DIAGNOSIS — R928 Other abnormal and inconclusive findings on diagnostic imaging of breast: Secondary | ICD-10-CM | POA: Diagnosis not present

## 2021-10-22 DIAGNOSIS — S138XXA Sprain of joints and ligaments of other parts of neck, initial encounter: Secondary | ICD-10-CM | POA: Diagnosis not present

## 2021-10-22 DIAGNOSIS — S335XXA Sprain of ligaments of lumbar spine, initial encounter: Secondary | ICD-10-CM | POA: Diagnosis not present

## 2021-10-22 DIAGNOSIS — M9902 Segmental and somatic dysfunction of thoracic region: Secondary | ICD-10-CM | POA: Diagnosis not present

## 2021-10-22 DIAGNOSIS — S29012A Strain of muscle and tendon of back wall of thorax, initial encounter: Secondary | ICD-10-CM | POA: Diagnosis not present

## 2021-10-22 DIAGNOSIS — M9903 Segmental and somatic dysfunction of lumbar region: Secondary | ICD-10-CM | POA: Diagnosis not present

## 2021-10-22 DIAGNOSIS — M9901 Segmental and somatic dysfunction of cervical region: Secondary | ICD-10-CM | POA: Diagnosis not present

## 2021-10-28 DIAGNOSIS — E1151 Type 2 diabetes mellitus with diabetic peripheral angiopathy without gangrene: Secondary | ICD-10-CM | POA: Diagnosis not present

## 2021-10-28 DIAGNOSIS — B351 Tinea unguium: Secondary | ICD-10-CM | POA: Diagnosis not present

## 2021-10-28 DIAGNOSIS — L84 Corns and callosities: Secondary | ICD-10-CM | POA: Diagnosis not present

## 2021-11-12 DIAGNOSIS — M9902 Segmental and somatic dysfunction of thoracic region: Secondary | ICD-10-CM | POA: Diagnosis not present

## 2021-11-12 DIAGNOSIS — S138XXA Sprain of joints and ligaments of other parts of neck, initial encounter: Secondary | ICD-10-CM | POA: Diagnosis not present

## 2021-11-12 DIAGNOSIS — M9903 Segmental and somatic dysfunction of lumbar region: Secondary | ICD-10-CM | POA: Diagnosis not present

## 2021-11-12 DIAGNOSIS — S29012A Strain of muscle and tendon of back wall of thorax, initial encounter: Secondary | ICD-10-CM | POA: Diagnosis not present

## 2021-11-12 DIAGNOSIS — S335XXA Sprain of ligaments of lumbar spine, initial encounter: Secondary | ICD-10-CM | POA: Diagnosis not present

## 2021-11-12 DIAGNOSIS — M9901 Segmental and somatic dysfunction of cervical region: Secondary | ICD-10-CM | POA: Diagnosis not present

## 2021-11-12 NOTE — Progress Notes (Unsigned)
Kelly Quinn [birth year] [id number]   History:  76 y.o. G0 presents for breast and pelvic exam. Complains of anal irritation. Denies diarrhea, constipation, bleeding, or itching. Just feels it is tender with wiping. Uses Metamucil. Postmenopausal - no HRT, no bleeding. Normal pap and mammogram history. History of osteopenia, managed by PCP. Took Fosamax for a year but did not tolerate.   Gynecologic History No LMP recorded. Patient is postmenopausal.   Contraception/Family planning: post menopausal status Sexually active: No  Health Maintenance Last Pap: 10/05/2012. Results were: Normal Last mammogram: 09/2021 per patient. Results were: Diagnostic normal Last colonoscopy: 10/04/2021. Results were: Tubular adenoma, 3-year repeat Last Dexa: 09/15/2017. Results were: T-score -2.2 FRAX 7.1% / 1.2%  Past medical history, past surgical history, family history and social history were all reviewed and documented in the EPIC chart. Widowed. Adopted daughter with 3 children ages 74, 40, and 72.   ROS:  A ROS was performed and pertinent positives and negatives are included.  Exam:  Vitals:   11/15/21 0911  BP: 112/72  Pulse: 83  SpO2: 98%  Weight: 167 lb (75.8 kg)  Height: 5' 7.75" (1.721 m)     Body mass index is 25.58 kg/m.  General appearance:  Normal Thyroid:  Symmetrical, normal in size, without palpable masses or nodularity. Respiratory  Auscultation:  Clear without wheezing or rhonchi Cardiovascular  Auscultation:  Regular rate, without rubs, murmurs or gallops  Edema/varicosities:  Not grossly evident Abdominal  Soft,nontender, without masses, guarding or rebound.  Liver/spleen:  No organomegaly noted  Hernia:  None appreciated  Skin  Inspection:  Grossly normal   Breasts: Examined lying and sitting.   Right: Without masses, retractions, discharge or axillary adenopathy.   Left: Without masses, retractions, discharge or axillary adenopathy. Genitourinary    Inguinal/mons:  Normal without inguinal adenopathy  External genitalia:  Normal appearing vulva with no masses, tenderness, or lesions  BUS/Urethra/Skene's glands:  Normal  Vagina:  Normal appearing with normal color and discharge, no lesions. Atrophic changes  Cervix:  Normal appearing without discharge or lesions  Uterus:  Normal in size, shape and contour.  Midline and mobile, nontender  Adnexa/parametria:     Rt: Normal in size, without masses or tenderness.   Lt: Normal in size, without masses or tenderness.  Anus and perineum: Mild erythema of anus. No hemorrhoids  Digital rectal exam: Normal sphincter tone without palpated masses or tenderness  Patient informed chaperone available to be present for breast and pelvic exam. Patient has requested no chaperone to be present. Patient has been advised what will be completed during breast and pelvic exam.   Assessment/Plan:  76 y.o. G for breast and pelvic exam.   Well female exam with routine gynecological exam - Education provided on SBEs, importance of preventative screenings, current guidelines, high calcium diet, regular exercise, and multivitamin daily. Labs with PCP.   Osteopenia of multiple sites - Continue Vitamin D + calcium supplement, regular exercise and weightbearing exercises. PCP manages this. She was on Fosamax x 1 year but did not tolerated. Recommend repeating Dexa and she will follow up with PCP about this.   Postmenopausal - no HRT, no bleeding  Dermatitis - Plan: betamethasone valerate ointment (VALISONE) 0.1 % BID x 7-10 days. Complains of anal irritation. Denies diarrhea, constipation, bleeding, or itching. Just feels it is tender with wiping. Uses Metamucil. Apply barrier ointment such as Aquaphor, Vaseline, or A&D, use gentle wet wipes, avoid over cleaning/wiping.    Screening for cervical cancer -  Normal Pap history. No longer screening per guidelines.   Screening for breast cancer - Normal mammogram history.   Continue annual screenings.  Normal breast exam today.  Screening for colon cancer -Colonoscopy 09/2021, tubular adenoma. Will repeat at 3-year interval per GI's recommendation.   Follow up in 2 years breast and pelvic exam.       Tamela Gammon [facility], 10:09 AM 11/15/2021

## 2021-11-15 ENCOUNTER — Encounter: Payer: Self-pay | Admitting: Nurse Practitioner

## 2021-11-15 ENCOUNTER — Ambulatory Visit (INDEPENDENT_AMBULATORY_CARE_PROVIDER_SITE_OTHER): Payer: Medicare HMO | Admitting: Nurse Practitioner

## 2021-11-15 VITALS — BP 112/72 | HR 83 | Ht 67.75 in | Wt 167.0 lb

## 2021-11-15 DIAGNOSIS — L309 Dermatitis, unspecified: Secondary | ICD-10-CM

## 2021-11-15 DIAGNOSIS — Z01419 Encounter for gynecological examination (general) (routine) without abnormal findings: Secondary | ICD-10-CM

## 2021-11-15 DIAGNOSIS — M8589 Other specified disorders of bone density and structure, multiple sites: Secondary | ICD-10-CM | POA: Diagnosis not present

## 2021-11-15 DIAGNOSIS — M5412 Radiculopathy, cervical region: Secondary | ICD-10-CM | POA: Diagnosis not present

## 2021-11-15 DIAGNOSIS — Z780 Asymptomatic menopausal state: Secondary | ICD-10-CM

## 2021-11-15 DIAGNOSIS — M7582 Other shoulder lesions, left shoulder: Secondary | ICD-10-CM | POA: Diagnosis not present

## 2021-11-15 MED ORDER — BETAMETHASONE VALERATE 0.1 % EX OINT: TOPICAL_OINTMENT | Freq: Two times a day (BID) | CUTANEOUS | 1 refills | Status: AC

## 2021-11-17 DIAGNOSIS — I10 Essential (primary) hypertension: Secondary | ICD-10-CM | POA: Diagnosis not present

## 2021-11-17 DIAGNOSIS — E119 Type 2 diabetes mellitus without complications: Secondary | ICD-10-CM | POA: Diagnosis not present

## 2021-11-17 DIAGNOSIS — E7800 Pure hypercholesterolemia, unspecified: Secondary | ICD-10-CM | POA: Diagnosis not present

## 2021-11-19 ENCOUNTER — Encounter: Payer: Self-pay | Admitting: Nurse Practitioner

## 2021-12-03 DIAGNOSIS — M7502 Adhesive capsulitis of left shoulder: Secondary | ICD-10-CM | POA: Diagnosis not present

## 2021-12-03 DIAGNOSIS — S46012D Strain of muscle(s) and tendon(s) of the rotator cuff of left shoulder, subsequent encounter: Secondary | ICD-10-CM | POA: Diagnosis not present

## 2021-12-03 DIAGNOSIS — M6281 Muscle weakness (generalized): Secondary | ICD-10-CM | POA: Diagnosis not present

## 2021-12-03 DIAGNOSIS — M25612 Stiffness of left shoulder, not elsewhere classified: Secondary | ICD-10-CM | POA: Diagnosis not present

## 2021-12-14 DIAGNOSIS — M25612 Stiffness of left shoulder, not elsewhere classified: Secondary | ICD-10-CM | POA: Diagnosis not present

## 2021-12-14 DIAGNOSIS — M6281 Muscle weakness (generalized): Secondary | ICD-10-CM | POA: Diagnosis not present

## 2021-12-14 DIAGNOSIS — M7502 Adhesive capsulitis of left shoulder: Secondary | ICD-10-CM | POA: Diagnosis not present

## 2021-12-14 DIAGNOSIS — S46012D Strain of muscle(s) and tendon(s) of the rotator cuff of left shoulder, subsequent encounter: Secondary | ICD-10-CM | POA: Diagnosis not present

## 2021-12-17 DIAGNOSIS — S46012D Strain of muscle(s) and tendon(s) of the rotator cuff of left shoulder, subsequent encounter: Secondary | ICD-10-CM | POA: Diagnosis not present

## 2021-12-17 DIAGNOSIS — M7502 Adhesive capsulitis of left shoulder: Secondary | ICD-10-CM | POA: Diagnosis not present

## 2021-12-17 DIAGNOSIS — M6281 Muscle weakness (generalized): Secondary | ICD-10-CM | POA: Diagnosis not present

## 2021-12-17 DIAGNOSIS — M25612 Stiffness of left shoulder, not elsewhere classified: Secondary | ICD-10-CM | POA: Diagnosis not present

## 2021-12-22 DIAGNOSIS — M6281 Muscle weakness (generalized): Secondary | ICD-10-CM | POA: Diagnosis not present

## 2021-12-22 DIAGNOSIS — M25612 Stiffness of left shoulder, not elsewhere classified: Secondary | ICD-10-CM | POA: Diagnosis not present

## 2021-12-22 DIAGNOSIS — M7502 Adhesive capsulitis of left shoulder: Secondary | ICD-10-CM | POA: Diagnosis not present

## 2021-12-22 DIAGNOSIS — S46012D Strain of muscle(s) and tendon(s) of the rotator cuff of left shoulder, subsequent encounter: Secondary | ICD-10-CM | POA: Diagnosis not present

## 2021-12-23 DIAGNOSIS — T783XXA Angioneurotic edema, initial encounter: Secondary | ICD-10-CM | POA: Diagnosis not present

## 2021-12-23 DIAGNOSIS — L658 Other specified nonscarring hair loss: Secondary | ICD-10-CM | POA: Diagnosis not present

## 2021-12-23 DIAGNOSIS — L219 Seborrheic dermatitis, unspecified: Secondary | ICD-10-CM | POA: Diagnosis not present

## 2021-12-23 DIAGNOSIS — L249 Irritant contact dermatitis, unspecified cause: Secondary | ICD-10-CM | POA: Diagnosis not present

## 2021-12-23 DIAGNOSIS — L299 Pruritus, unspecified: Secondary | ICD-10-CM | POA: Diagnosis not present

## 2021-12-24 DIAGNOSIS — M6281 Muscle weakness (generalized): Secondary | ICD-10-CM | POA: Diagnosis not present

## 2021-12-24 DIAGNOSIS — S46012D Strain of muscle(s) and tendon(s) of the rotator cuff of left shoulder, subsequent encounter: Secondary | ICD-10-CM | POA: Diagnosis not present

## 2021-12-24 DIAGNOSIS — M25612 Stiffness of left shoulder, not elsewhere classified: Secondary | ICD-10-CM | POA: Diagnosis not present

## 2021-12-24 DIAGNOSIS — M7502 Adhesive capsulitis of left shoulder: Secondary | ICD-10-CM | POA: Diagnosis not present

## 2021-12-27 DIAGNOSIS — M7502 Adhesive capsulitis of left shoulder: Secondary | ICD-10-CM | POA: Diagnosis not present

## 2021-12-27 DIAGNOSIS — S46012D Strain of muscle(s) and tendon(s) of the rotator cuff of left shoulder, subsequent encounter: Secondary | ICD-10-CM | POA: Diagnosis not present

## 2021-12-27 DIAGNOSIS — M6281 Muscle weakness (generalized): Secondary | ICD-10-CM | POA: Diagnosis not present

## 2021-12-27 DIAGNOSIS — M25612 Stiffness of left shoulder, not elsewhere classified: Secondary | ICD-10-CM | POA: Diagnosis not present

## 2021-12-30 DIAGNOSIS — R21 Rash and other nonspecific skin eruption: Secondary | ICD-10-CM | POA: Diagnosis not present

## 2021-12-30 DIAGNOSIS — J301 Allergic rhinitis due to pollen: Secondary | ICD-10-CM | POA: Diagnosis not present

## 2021-12-30 DIAGNOSIS — L509 Urticaria, unspecified: Secondary | ICD-10-CM | POA: Diagnosis not present

## 2021-12-30 DIAGNOSIS — J3089 Other allergic rhinitis: Secondary | ICD-10-CM | POA: Diagnosis not present

## 2021-12-30 DIAGNOSIS — M810 Age-related osteoporosis without current pathological fracture: Secondary | ICD-10-CM | POA: Diagnosis not present

## 2021-12-30 DIAGNOSIS — M85852 Other specified disorders of bone density and structure, left thigh: Secondary | ICD-10-CM | POA: Diagnosis not present

## 2021-12-30 DIAGNOSIS — Z780 Asymptomatic menopausal state: Secondary | ICD-10-CM | POA: Diagnosis not present

## 2021-12-30 DIAGNOSIS — M85851 Other specified disorders of bone density and structure, right thigh: Secondary | ICD-10-CM | POA: Diagnosis not present

## 2021-12-30 DIAGNOSIS — T783XXD Angioneurotic edema, subsequent encounter: Secondary | ICD-10-CM | POA: Diagnosis not present

## 2021-12-31 DIAGNOSIS — M25612 Stiffness of left shoulder, not elsewhere classified: Secondary | ICD-10-CM | POA: Diagnosis not present

## 2021-12-31 DIAGNOSIS — S46012D Strain of muscle(s) and tendon(s) of the rotator cuff of left shoulder, subsequent encounter: Secondary | ICD-10-CM | POA: Diagnosis not present

## 2021-12-31 DIAGNOSIS — M6281 Muscle weakness (generalized): Secondary | ICD-10-CM | POA: Diagnosis not present

## 2021-12-31 DIAGNOSIS — M7502 Adhesive capsulitis of left shoulder: Secondary | ICD-10-CM | POA: Diagnosis not present

## 2022-01-03 DIAGNOSIS — R220 Localized swelling, mass and lump, head: Secondary | ICD-10-CM | POA: Diagnosis not present

## 2022-01-03 DIAGNOSIS — E1151 Type 2 diabetes mellitus with diabetic peripheral angiopathy without gangrene: Secondary | ICD-10-CM | POA: Diagnosis not present

## 2022-01-03 DIAGNOSIS — M25612 Stiffness of left shoulder, not elsewhere classified: Secondary | ICD-10-CM | POA: Diagnosis not present

## 2022-01-03 DIAGNOSIS — M205X1 Other deformities of toe(s) (acquired), right foot: Secondary | ICD-10-CM | POA: Diagnosis not present

## 2022-01-03 DIAGNOSIS — D2371 Other benign neoplasm of skin of right lower limb, including hip: Secondary | ICD-10-CM | POA: Diagnosis not present

## 2022-01-03 DIAGNOSIS — M7502 Adhesive capsulitis of left shoulder: Secondary | ICD-10-CM | POA: Diagnosis not present

## 2022-01-03 DIAGNOSIS — M6281 Muscle weakness (generalized): Secondary | ICD-10-CM | POA: Diagnosis not present

## 2022-01-03 DIAGNOSIS — I70203 Unspecified atherosclerosis of native arteries of extremities, bilateral legs: Secondary | ICD-10-CM | POA: Diagnosis not present

## 2022-01-03 DIAGNOSIS — S46012D Strain of muscle(s) and tendon(s) of the rotator cuff of left shoulder, subsequent encounter: Secondary | ICD-10-CM | POA: Diagnosis not present

## 2022-01-03 DIAGNOSIS — R5383 Other fatigue: Secondary | ICD-10-CM | POA: Diagnosis not present

## 2022-01-03 DIAGNOSIS — L84 Corns and callosities: Secondary | ICD-10-CM | POA: Diagnosis not present

## 2022-01-03 DIAGNOSIS — M205X2 Other deformities of toe(s) (acquired), left foot: Secondary | ICD-10-CM | POA: Diagnosis not present

## 2022-01-03 DIAGNOSIS — B351 Tinea unguium: Secondary | ICD-10-CM | POA: Diagnosis not present

## 2022-01-03 DIAGNOSIS — D2372 Other benign neoplasm of skin of left lower limb, including hip: Secondary | ICD-10-CM | POA: Diagnosis not present

## 2022-01-05 DIAGNOSIS — M7502 Adhesive capsulitis of left shoulder: Secondary | ICD-10-CM | POA: Diagnosis not present

## 2022-01-05 DIAGNOSIS — M25612 Stiffness of left shoulder, not elsewhere classified: Secondary | ICD-10-CM | POA: Diagnosis not present

## 2022-01-05 DIAGNOSIS — S46012D Strain of muscle(s) and tendon(s) of the rotator cuff of left shoulder, subsequent encounter: Secondary | ICD-10-CM | POA: Diagnosis not present

## 2022-01-05 DIAGNOSIS — M6281 Muscle weakness (generalized): Secondary | ICD-10-CM | POA: Diagnosis not present

## 2022-01-06 LAB — LAB REPORT - SCANNED: EGFR: 95

## 2022-01-17 DIAGNOSIS — M5412 Radiculopathy, cervical region: Secondary | ICD-10-CM | POA: Diagnosis not present

## 2022-01-17 DIAGNOSIS — M546 Pain in thoracic spine: Secondary | ICD-10-CM | POA: Diagnosis not present

## 2022-02-07 DIAGNOSIS — T783XXD Angioneurotic edema, subsequent encounter: Secondary | ICD-10-CM | POA: Diagnosis not present

## 2022-02-07 DIAGNOSIS — H6990 Unspecified Eustachian tube disorder, unspecified ear: Secondary | ICD-10-CM | POA: Diagnosis not present

## 2022-02-14 DIAGNOSIS — T783XXA Angioneurotic edema, initial encounter: Secondary | ICD-10-CM | POA: Diagnosis not present

## 2022-03-03 DIAGNOSIS — L508 Other urticaria: Secondary | ICD-10-CM | POA: Diagnosis not present

## 2022-03-03 DIAGNOSIS — T783XXA Angioneurotic edema, initial encounter: Secondary | ICD-10-CM | POA: Diagnosis not present

## 2022-03-03 DIAGNOSIS — R6889 Other general symptoms and signs: Secondary | ICD-10-CM | POA: Diagnosis not present

## 2022-03-16 DIAGNOSIS — I70203 Unspecified atherosclerosis of native arteries of extremities, bilateral legs: Secondary | ICD-10-CM | POA: Diagnosis not present

## 2022-03-16 DIAGNOSIS — E1151 Type 2 diabetes mellitus with diabetic peripheral angiopathy without gangrene: Secondary | ICD-10-CM | POA: Diagnosis not present

## 2022-03-16 DIAGNOSIS — D2372 Other benign neoplasm of skin of left lower limb, including hip: Secondary | ICD-10-CM | POA: Diagnosis not present

## 2022-03-16 DIAGNOSIS — D2371 Other benign neoplasm of skin of right lower limb, including hip: Secondary | ICD-10-CM | POA: Diagnosis not present

## 2022-03-16 DIAGNOSIS — L84 Corns and callosities: Secondary | ICD-10-CM | POA: Diagnosis not present

## 2022-03-16 DIAGNOSIS — B351 Tinea unguium: Secondary | ICD-10-CM | POA: Diagnosis not present

## 2022-03-16 DIAGNOSIS — M205X2 Other deformities of toe(s) (acquired), left foot: Secondary | ICD-10-CM | POA: Diagnosis not present

## 2022-03-16 DIAGNOSIS — M205X1 Other deformities of toe(s) (acquired), right foot: Secondary | ICD-10-CM | POA: Diagnosis not present

## 2022-05-05 DIAGNOSIS — T783XXD Angioneurotic edema, subsequent encounter: Secondary | ICD-10-CM | POA: Diagnosis not present

## 2022-05-05 DIAGNOSIS — H6121 Impacted cerumen, right ear: Secondary | ICD-10-CM | POA: Diagnosis not present

## 2022-05-05 DIAGNOSIS — R0981 Nasal congestion: Secondary | ICD-10-CM | POA: Diagnosis not present

## 2022-05-05 DIAGNOSIS — J0100 Acute maxillary sinusitis, unspecified: Secondary | ICD-10-CM | POA: Diagnosis not present

## 2022-05-05 DIAGNOSIS — R519 Headache, unspecified: Secondary | ICD-10-CM | POA: Diagnosis not present

## 2022-05-09 DIAGNOSIS — Z7984 Long term (current) use of oral hypoglycemic drugs: Secondary | ICD-10-CM | POA: Diagnosis not present

## 2022-05-09 DIAGNOSIS — E119 Type 2 diabetes mellitus without complications: Secondary | ICD-10-CM | POA: Diagnosis not present

## 2022-05-09 DIAGNOSIS — Z961 Presence of intraocular lens: Secondary | ICD-10-CM | POA: Diagnosis not present

## 2022-05-09 DIAGNOSIS — H524 Presbyopia: Secondary | ICD-10-CM | POA: Diagnosis not present

## 2022-05-23 DIAGNOSIS — T783XXA Angioneurotic edema, initial encounter: Secondary | ICD-10-CM | POA: Diagnosis not present

## 2022-05-23 DIAGNOSIS — L309 Dermatitis, unspecified: Secondary | ICD-10-CM | POA: Diagnosis not present

## 2022-05-23 DIAGNOSIS — L509 Urticaria, unspecified: Secondary | ICD-10-CM | POA: Diagnosis not present

## 2022-05-23 DIAGNOSIS — D2371 Other benign neoplasm of skin of right lower limb, including hip: Secondary | ICD-10-CM | POA: Diagnosis not present

## 2022-05-23 DIAGNOSIS — D1801 Hemangioma of skin and subcutaneous tissue: Secondary | ICD-10-CM | POA: Diagnosis not present

## 2022-05-23 DIAGNOSIS — L821 Other seborrheic keratosis: Secondary | ICD-10-CM | POA: Diagnosis not present

## 2022-05-23 DIAGNOSIS — L718 Other rosacea: Secondary | ICD-10-CM | POA: Diagnosis not present

## 2022-05-23 DIAGNOSIS — L819 Disorder of pigmentation, unspecified: Secondary | ICD-10-CM | POA: Diagnosis not present

## 2022-05-31 DIAGNOSIS — M25551 Pain in right hip: Secondary | ICD-10-CM | POA: Diagnosis not present

## 2022-05-31 DIAGNOSIS — M25561 Pain in right knee: Secondary | ICD-10-CM | POA: Diagnosis not present

## 2022-06-13 DIAGNOSIS — M6281 Muscle weakness (generalized): Secondary | ICD-10-CM | POA: Diagnosis not present

## 2022-06-13 DIAGNOSIS — M7631 Iliotibial band syndrome, right leg: Secondary | ICD-10-CM | POA: Diagnosis not present

## 2022-06-14 DIAGNOSIS — D2371 Other benign neoplasm of skin of right lower limb, including hip: Secondary | ICD-10-CM | POA: Diagnosis not present

## 2022-06-14 DIAGNOSIS — M205X1 Other deformities of toe(s) (acquired), right foot: Secondary | ICD-10-CM | POA: Diagnosis not present

## 2022-06-14 DIAGNOSIS — I70203 Unspecified atherosclerosis of native arteries of extremities, bilateral legs: Secondary | ICD-10-CM | POA: Diagnosis not present

## 2022-06-14 DIAGNOSIS — B351 Tinea unguium: Secondary | ICD-10-CM | POA: Diagnosis not present

## 2022-06-14 DIAGNOSIS — D2372 Other benign neoplasm of skin of left lower limb, including hip: Secondary | ICD-10-CM | POA: Diagnosis not present

## 2022-06-14 DIAGNOSIS — M205X2 Other deformities of toe(s) (acquired), left foot: Secondary | ICD-10-CM | POA: Diagnosis not present

## 2022-06-14 DIAGNOSIS — E1151 Type 2 diabetes mellitus with diabetic peripheral angiopathy without gangrene: Secondary | ICD-10-CM | POA: Diagnosis not present

## 2022-06-14 DIAGNOSIS — L84 Corns and callosities: Secondary | ICD-10-CM | POA: Diagnosis not present

## 2022-06-20 DIAGNOSIS — R053 Chronic cough: Secondary | ICD-10-CM | POA: Diagnosis not present

## 2022-06-20 DIAGNOSIS — J40 Bronchitis, not specified as acute or chronic: Secondary | ICD-10-CM | POA: Diagnosis not present

## 2022-06-27 DIAGNOSIS — M7631 Iliotibial band syndrome, right leg: Secondary | ICD-10-CM | POA: Diagnosis not present

## 2022-06-27 DIAGNOSIS — M6281 Muscle weakness (generalized): Secondary | ICD-10-CM | POA: Diagnosis not present

## 2022-06-30 DIAGNOSIS — M6281 Muscle weakness (generalized): Secondary | ICD-10-CM | POA: Diagnosis not present

## 2022-06-30 DIAGNOSIS — M7631 Iliotibial band syndrome, right leg: Secondary | ICD-10-CM | POA: Diagnosis not present

## 2022-07-08 DIAGNOSIS — M6281 Muscle weakness (generalized): Secondary | ICD-10-CM | POA: Diagnosis not present

## 2022-07-08 DIAGNOSIS — M7631 Iliotibial band syndrome, right leg: Secondary | ICD-10-CM | POA: Diagnosis not present

## 2022-07-18 DIAGNOSIS — E1165 Type 2 diabetes mellitus with hyperglycemia: Secondary | ICD-10-CM | POA: Diagnosis not present

## 2022-07-18 DIAGNOSIS — L508 Other urticaria: Secondary | ICD-10-CM | POA: Diagnosis not present

## 2022-07-18 DIAGNOSIS — J301 Allergic rhinitis due to pollen: Secondary | ICD-10-CM | POA: Diagnosis not present

## 2022-07-18 DIAGNOSIS — I10 Essential (primary) hypertension: Secondary | ICD-10-CM | POA: Diagnosis not present

## 2022-07-18 DIAGNOSIS — Z0000 Encounter for general adult medical examination without abnormal findings: Secondary | ICD-10-CM | POA: Diagnosis not present

## 2022-07-18 DIAGNOSIS — E7800 Pure hypercholesterolemia, unspecified: Secondary | ICD-10-CM | POA: Diagnosis not present

## 2022-07-18 DIAGNOSIS — E119 Type 2 diabetes mellitus without complications: Secondary | ICD-10-CM | POA: Diagnosis not present

## 2022-07-20 DIAGNOSIS — M6281 Muscle weakness (generalized): Secondary | ICD-10-CM | POA: Diagnosis not present

## 2022-07-20 DIAGNOSIS — M7631 Iliotibial band syndrome, right leg: Secondary | ICD-10-CM | POA: Diagnosis not present

## 2022-07-22 DIAGNOSIS — M7631 Iliotibial band syndrome, right leg: Secondary | ICD-10-CM | POA: Diagnosis not present

## 2022-07-22 DIAGNOSIS — M6281 Muscle weakness (generalized): Secondary | ICD-10-CM | POA: Diagnosis not present

## 2022-07-25 DIAGNOSIS — M7631 Iliotibial band syndrome, right leg: Secondary | ICD-10-CM | POA: Diagnosis not present

## 2022-07-25 DIAGNOSIS — M6281 Muscle weakness (generalized): Secondary | ICD-10-CM | POA: Diagnosis not present

## 2022-07-28 DIAGNOSIS — M7631 Iliotibial band syndrome, right leg: Secondary | ICD-10-CM | POA: Diagnosis not present

## 2022-07-28 DIAGNOSIS — M6281 Muscle weakness (generalized): Secondary | ICD-10-CM | POA: Diagnosis not present

## 2022-09-01 ENCOUNTER — Encounter: Payer: Self-pay | Admitting: Allergy

## 2022-09-01 ENCOUNTER — Ambulatory Visit (INDEPENDENT_AMBULATORY_CARE_PROVIDER_SITE_OTHER): Payer: Medicare HMO | Admitting: Allergy

## 2022-09-01 ENCOUNTER — Other Ambulatory Visit: Payer: Self-pay

## 2022-09-01 VITALS — BP 160/70 | HR 88 | Temp 98.3°F | Resp 12 | Ht 67.13 in | Wt 166.1 lb

## 2022-09-01 DIAGNOSIS — J309 Allergic rhinitis, unspecified: Secondary | ICD-10-CM | POA: Diagnosis not present

## 2022-09-01 DIAGNOSIS — L508 Other urticaria: Secondary | ICD-10-CM

## 2022-09-01 DIAGNOSIS — H1010 Acute atopic conjunctivitis, unspecified eye: Secondary | ICD-10-CM

## 2022-09-01 DIAGNOSIS — T783XXD Angioneurotic edema, subsequent encounter: Secondary | ICD-10-CM | POA: Diagnosis not present

## 2022-09-01 DIAGNOSIS — H1013 Acute atopic conjunctivitis, bilateral: Secondary | ICD-10-CM

## 2022-09-01 MED ORDER — FAMOTIDINE 20 MG PO TABS
20.0000 mg | ORAL_TABLET | Freq: Two times a day (BID) | ORAL | 5 refills | Status: DC
Start: 2022-09-01 — End: 2022-12-02

## 2022-09-01 MED ORDER — CETIRIZINE HCL 10 MG PO TABS: 10.0000 mg | ORAL_TABLET | Freq: Two times a day (BID) | ORAL | 5 refills | Status: DC

## 2022-09-01 NOTE — Patient Instructions (Addendum)
-   at this time etiology of hives and swelling is most likely spontaneous in nature.  Hives can be caused by a variety of different triggers including illness/infection, foods, medications, stings, exercise, pressure, vibrations, extremes of temperature to name a few however majority of the time there is no identifiable trigger.    - you have had a reassuring workup thus far including blood cell counts, liver and kidney function, hereditary swelling panel, inflammatory marker, thyroid studies.    - will complete workup done thus far by obtaining tryptase level, autoimmune hive marker, alpha gal panel and environmental allergy panel - discussed high-dose antihistamine regimen is the main sting of managing recurrent hives and swelling.  This typically includes a H1 blocker like Zyrtec WITH an H2 blocker Pepcid.  Take Zyrtec and Pepcid twice a day. If twice a day dosing is not effective enough to discuss options of setting up therapy with adding either Singulair oral tablet and/or Xolair monthly injections   Follow-up in 3 months or sooner if needed

## 2022-09-01 NOTE — Progress Notes (Signed)
New Patient Note  RE: Kelly Quinn MRN: [id number] DOB: Dec 06, 1945 Date of Office Visit: 09/01/2022  Primary care provider: Johny Blamer, MD  Chief Complaint: swelling, hives  History of present illness: Kelly Quinn is a 77 y.o. female presenting today for evaluation of urticaria and angioedema.  She states she is doing okay at this moment.   She went to [facility] last year and was diagnosed with chronic urticaria with angioedema.   She states she was having recurrent episodes of hives and swelling with her first episode in 2016.  She states that the time she has been taking aspirin but she did stop this.  She had another flare of the hives and swelling in 2018 and then a 5-year period of no swelling or hives.  They returned again last year on fall.  Her most recent episode of lip swelling with hives close around the end of January.  She has not been able to identify any triggers of the hives or swelling.  She states when she does have the lip swelling that it can hurt and even hurt when she brushes her teeth.  The hives and swelling can last for a week.  The hives do not leave the risk for hide once resolved.  She has kept a food diary for a while but there was nothing consistent with her diet in these episodes.  Before she saw the specialist at [facility] she did see the Dr. Madie Reno with [facility].  She brought in her previous labwork done in September 2023 that shows a normal ESR, HAE panel, thyroid studies/antibodies, CMP, CBC, CH50.  She states she had environmental allergy testing in 2003.  She has no history of food allergy.  For her hives and swelling she was recommended to take zyrtec 1 tab twice a day. She states she has regular allergies and was already doing 1 tablet of Zyrtec daily.  With her regular allergies she reports symptoms including itchy eyes and nasal congestion/drainage.  Flonase helps but does irritate the nose a bit.  She does use nasal saline spray.  Review of  systems in the past 4 weeks: Review of Systems  Constitutional: Negative.   HENT: Negative.    Eyes: Negative.   Respiratory: Negative.    Cardiovascular: Negative.   Gastrointestinal: Negative.   Musculoskeletal: Negative.   Skin: Negative.   Allergic/Immunologic: Negative.   Neurological: Negative.     All other systems negative unless noted above in HPI  Past medical history: Past Medical History:  Diagnosis Date   Acid reflux    Allergy    Angio-edema    Arthritis    Bil knees   Hypertension    Osteoporosis 08/2017   T score -2.2 prior 2017 T score -2.6    Post-operative nausea and vomiting    Urticaria     Past surgical history: Past Surgical History:  Procedure Laterality Date   CATARACT EXTRACTION, BILATERAL     COLONOSCOPY     DIAGNOSTIC LAPAROSCOPY  11/2015   Pt unsure of this surg!/torn cartilage in left knee   ELBOW SURGERY     right elbow   KNEE SURGERY     Arthroscopic   SALIVARY STONE REMOVAL  09/2012   wart removed under eye      Family history:  Family History  Problem Relation Age of Onset   Allergic rhinitis Mother    Hypertension Mother    Allergic rhinitis Sister    Hypertension Sister  Lymphoma Sister    Colon polyps Sister    Breast cancer Sister    Colon polyps Brother    Diabetes Brother    Kidney disease Brother    Diabetes Brother    Colon cancer Neg Hx    Esophageal cancer Neg Hx    Stomach cancer Neg Hx    Rectal cancer Neg Hx     Social history: Lives in a home with carpeting with propane gas heating and central cooling.  No pets in the home.  No concern for water damage, mildew or roaches in the home.  She is retired.  She denies a smoking history   Medication List: Current Outpatient Medications  Medication Sig Dispense Refill   Accu-Chek Softclix Lancets lancets SMARTSIG:Topical     Alcohol Swabs (DROPSAFE ALCOHOL PREP) 70 % PADS Apply topically.     calcium gluconate 500 MG tablet Take 3 tablets by mouth  daily.     cetirizine (ZYRTEC) 10 MG tablet Take 10 mg by mouth as needed.      Cholecalciferol (VITAMIN D PO) Take by mouth. Take 2 gummies daily.     COLLAGEN PO Take 1 tablet by mouth daily.     Cyanocobalamin (VITAMIN B 12 PO) Take 1 tablet by mouth. Take 2 gummies daily     cyclobenzaprine (FLEXERIL) 10 MG tablet Oral     EPINEPHrine 0.3 mg/0.3 mL IJ SOAJ injection Injection     famciclovir (FAMVIR) 125 MG tablet Take 125 mg by mouth as needed.       finasteride (PROSCAR) 5 MG tablet Take by mouth.     fluocinonide ointment (LIDEX) 0.05 % Apply 1 Application topically as needed (itching).     fluticasone (FLONASE) 50 MCG/ACT nasal spray 2 sprays by Nasal route as needed.       hydrochlorothiazide (HYDRODIURIL) 12.5 MG tablet Take 12.5 mg by mouth as needed.     Ivermectin 1 % CREA Apply 1 Application topically daily.     metFORMIN (GLUCOPHAGE) 500 MG tablet Take by mouth daily.     NON FORMULARY Vita fusion MVI gummie-Take 2 daily     Omega-3 Fatty Acids (SALMON OIL-1000 PO) Take 1,000 mg by mouth daily.     PSYLLIUM PO Take by mouth daily.     rosuvastatin (CRESTOR) 10 MG tablet Take by mouth.     UNABLE TO FIND Take 482 mg by mouth daily. Tumeric     APPLE CIDER VINEGAR PO Take by mouth. 2 tbsp (Patient not taking: Reported on 09/01/2022)     Calcium-Phosphorus-Vitamin D (CALCIUM GUMMIES PO) Take 1,500 mg by mouth daily. (Patient not taking: Reported on 09/01/2022)     Collagen Hydrolysate POWD by Misc.(Non-Drug; Combo Route) route. (Patient not taking: Reported on 09/01/2022)     famciclovir (FAMVIR) 500 MG tablet Oral (Patient not taking: Reported on 09/01/2022)     NONFORMULARY OR COMPOUNDED ITEM Boric Acid 600 mg. Capsules #30 s: insert caps vaginally twice weekly. (Patient not taking: Reported on 09/01/2022) 30 each 1   triamcinolone cream (KENALOG) 0.1 % Apply topically 2 (two) times daily as needed. (Patient not taking: Reported on 09/01/2022) 30 g 1   No current  facility-administered medications for this visit.    Known medication allergies: Allergies  Allergen Reactions   Asa [Aspirin]     Swelling in lips   Antiseptic Products, Misc.     Clorox and Lysol stops pt up/congestion/cough   Codeine     Nauseous, stomach pain  Darvon     Upset stomach   Levofloxacin Hives   Other Dermatitis    Dawn dish soap in peach, SPF/Sun     Physical examination: Blood pressure (!) 160/70, pulse 88, temperature 98.3 F (36.8 C), temperature source Temporal, resp. rate 12, height 5' 7.13" (1.705 m), weight 166 lb 1.6 oz (75.3 kg), SpO2 97 %.  General: Alert, interactive, in no acute distress. HEENT: PERRLA, TMs pearly gray, turbinates non-edematous without discharge, post-pharynx non erythematous. Neck: Supple without lymphadenopathy. Lungs: Clear to auscultation without wheezing, rhonchi or rales. {no increased work of breathing. CV: Normal S1, S2 without murmurs. Abdomen: Nondistended, nontender. Skin: Warm and dry, without lesions or rashes. Extremities:  No clubbing, cyanosis or edema. Neuro:   Grossly intact.  Diagnositics/Labs: Labs: See HPI  Assessment and plan: Chronic urticaria with angioedema Allergic rhinitis with conjunctivitis    - at this time etiology of hives and swelling is most likely spontaneous in nature.  Hives can be caused by a variety of different triggers including illness/infection, foods, medications, stings, exercise, pressure, vibrations, extremes of temperature to name a few however majority of the time there is no identifiable trigger.    - you have had a reassuring workup thus far including blood cell counts, liver and kidney function, hereditary swelling panel, inflammatory marker, thyroid studies.    - will complete workup done thus far by obtaining tryptase level, autoimmune hive marker, alpha gal panel and environmental allergy panel - discussed high-dose antihistamine regimen is the main sting of managing  recurrent hives and swelling.  This typically includes a H1 blocker like Zyrtec WITH an H2 blocker Pepcid.  Take Zyrtec and Pepcid twice a day. If twice a day dosing is not effective enough to discuss options of setting up therapy with adding either Singulair oral tablet and/or Xolair monthly injections   Follow-up in 3 months or sooner if needed  I appreciate the opportunity to take part in Suesan's care. Please do not hesitate to contact me with questions.  Sincerely,   Margo Aye, MD Allergy/Immunology [facility]

## 2022-09-02 LAB — ALLERGENS W/TOTAL IGE AREA 2

## 2022-09-02 LAB — CHRONIC URTICARIA

## 2022-09-02 LAB — TRYPTASE

## 2022-09-02 LAB — ALPHA-GAL PANEL

## 2022-09-04 LAB — ALLERGENS W/TOTAL IGE AREA 2
Aspergillus Fumigatus IgE: <0.1 kU/L
Cladosporium Herbarum IgE: <0.1 kU/L
Cockroach, German IgE: 0.16 kU/L — AB
Cottonwood IgE: <0.1 kU/L
Dog Dander IgE: <0.1 kU/L
Elm, American IgE: <0.1 kU/L
Mouse Urine IgE: <0.1 kU/L
Ragweed, Short IgE: <0.1 kU/L
Sheep Sorrel IgE Qn: <0.1 kU/L
White Mulberry IgE: <0.1 kU/L

## 2022-09-04 LAB — ALPHA-GAL PANEL
IgE (Immunoglobulin E), Serum: 498 IU/mL — ABNORMAL HIGH (ref 6–495)
O215-IgE Alpha-Gal: <0.1 kU/L

## 2022-09-05 ENCOUNTER — Telehealth: Payer: Self-pay

## 2022-09-05 NOTE — Telephone Encounter (Signed)
Patient called in - DOB/Pharmacy verified - stated due to side effects of Famotidine that she read, he is very concerned with taking medication and would like something comparable to Famotidine.  Patient advised message would be forwarded to provider for next step.  Patient verbalized understanding, no further questions.  Pharmacy: [facility] [id number]

## 2022-09-06 NOTE — Telephone Encounter (Signed)
I called and spoke with the patient and she stated that she read that a side effect of Famotidine could be confusion, and that since she lives alone that really concerned her. I did speak with her about the Cimetidine and went over the side effects per her request, she did state that she would like for the Cimetidine to be sent in to try, please advise to dosage.

## 2022-09-07 LAB — ALLERGENS W/TOTAL IGE AREA 2
Alternaria Alternata IgE: <0.1 kU/L
Bermuda Grass IgE: <0.1 kU/L
Cat Dander IgE: <0.1 kU/L
Common Silver Birch IgE: <0.1 kU/L
D Farinae IgE: 0.23 kU/L — AB
D Pteronyssinus IgE: 0.22 kU/L — AB
Maple/Box Elder IgE: <0.1 kU/L
Pecan, Hickory IgE: <0.1 kU/L
Pigweed, Rough IgE: <0.1 kU/L
Timothy Grass IgE: <0.1 kU/L

## 2022-09-07 LAB — ALPHA-GAL PANEL
Allergen Lamb IgE: <0.1 kU/L
Beef IgE: <0.1 kU/L
Pork IgE: 0.23 kU/L — AB

## 2022-09-09 ENCOUNTER — Other Ambulatory Visit: Payer: Self-pay | Admitting: *Deleted

## 2022-09-09 ENCOUNTER — Other Ambulatory Visit: Payer: Self-pay | Admitting: Allergy

## 2022-09-09 MED ORDER — CIMETIDINE 200 MG PO TABS: 200.0000 mg | ORAL_TABLET | Freq: Two times a day (BID) | ORAL | 5 refills | Status: DC

## 2022-09-09 NOTE — Telephone Encounter (Signed)
New prescription has been sent in. I called the patient, patient verbalized understanding stating that she decided to try the famotidine. I advised she is fine to do whichever medication that helps.

## 2022-09-14 DIAGNOSIS — B351 Tinea unguium: Secondary | ICD-10-CM | POA: Diagnosis not present

## 2022-09-14 DIAGNOSIS — M205X1 Other deformities of toe(s) (acquired), right foot: Secondary | ICD-10-CM | POA: Diagnosis not present

## 2022-09-14 DIAGNOSIS — E1151 Type 2 diabetes mellitus with diabetic peripheral angiopathy without gangrene: Secondary | ICD-10-CM | POA: Diagnosis not present

## 2022-09-14 DIAGNOSIS — I70203 Unspecified atherosclerosis of native arteries of extremities, bilateral legs: Secondary | ICD-10-CM | POA: Diagnosis not present

## 2022-09-14 DIAGNOSIS — L84 Corns and callosities: Secondary | ICD-10-CM | POA: Diagnosis not present

## 2022-09-14 DIAGNOSIS — D2371 Other benign neoplasm of skin of right lower limb, including hip: Secondary | ICD-10-CM | POA: Diagnosis not present

## 2022-09-14 DIAGNOSIS — D2372 Other benign neoplasm of skin of left lower limb, including hip: Secondary | ICD-10-CM | POA: Diagnosis not present

## 2022-09-14 DIAGNOSIS — M205X2 Other deformities of toe(s) (acquired), left foot: Secondary | ICD-10-CM | POA: Diagnosis not present

## 2022-09-14 MED ORDER — EPINEPHRINE 0.3 MG/0.3ML IJ SOAJ: 0.3000 mg | INTRAMUSCULAR | 2 refills | Status: AC | PRN

## 2022-09-14 NOTE — Addendum Note (Signed)
Addended by: Elsworth Soho on: 09/14/2022 02:34 PM   Modules accepted: Orders

## 2022-09-19 DIAGNOSIS — H6123 Impacted cerumen, bilateral: Secondary | ICD-10-CM | POA: Diagnosis not present

## 2022-09-19 DIAGNOSIS — H9313 Tinnitus, bilateral: Secondary | ICD-10-CM | POA: Diagnosis not present

## 2022-09-19 DIAGNOSIS — H903 Sensorineural hearing loss, bilateral: Secondary | ICD-10-CM | POA: Diagnosis not present

## 2022-09-29 ENCOUNTER — Encounter: Payer: Self-pay | Admitting: Allergy

## 2022-10-11 DIAGNOSIS — M546 Pain in thoracic spine: Secondary | ICD-10-CM | POA: Diagnosis not present

## 2022-10-11 DIAGNOSIS — M25512 Pain in left shoulder: Secondary | ICD-10-CM | POA: Diagnosis not present

## 2022-10-13 DIAGNOSIS — Z1231 Encounter for screening mammogram for malignant neoplasm of breast: Secondary | ICD-10-CM | POA: Diagnosis not present

## 2022-10-27 DIAGNOSIS — M25512 Pain in left shoulder: Secondary | ICD-10-CM | POA: Diagnosis not present

## 2022-11-22 ENCOUNTER — Telehealth: Payer: Self-pay | Admitting: Allergy

## 2022-11-22 DIAGNOSIS — I70203 Unspecified atherosclerosis of native arteries of extremities, bilateral legs: Secondary | ICD-10-CM | POA: Diagnosis not present

## 2022-11-22 DIAGNOSIS — M205X1 Other deformities of toe(s) (acquired), right foot: Secondary | ICD-10-CM | POA: Diagnosis not present

## 2022-11-22 DIAGNOSIS — B351 Tinea unguium: Secondary | ICD-10-CM | POA: Diagnosis not present

## 2022-11-22 DIAGNOSIS — M205X2 Other deformities of toe(s) (acquired), left foot: Secondary | ICD-10-CM | POA: Diagnosis not present

## 2022-11-22 DIAGNOSIS — M7732 Calcaneal spur, left foot: Secondary | ICD-10-CM | POA: Diagnosis not present

## 2022-11-22 DIAGNOSIS — D2372 Other benign neoplasm of skin of left lower limb, including hip: Secondary | ICD-10-CM | POA: Diagnosis not present

## 2022-11-22 DIAGNOSIS — L84 Corns and callosities: Secondary | ICD-10-CM | POA: Diagnosis not present

## 2022-11-22 DIAGNOSIS — E1151 Type 2 diabetes mellitus with diabetic peripheral angiopathy without gangrene: Secondary | ICD-10-CM | POA: Diagnosis not present

## 2022-11-22 DIAGNOSIS — D2371 Other benign neoplasm of skin of right lower limb, including hip: Secondary | ICD-10-CM | POA: Diagnosis not present

## 2022-11-22 NOTE — Telephone Encounter (Signed)
Patient called asking if she can use Rosemary oil on her scalp please advise

## 2022-11-22 NOTE — Telephone Encounter (Signed)
Forwarding message to provider for next step. 

## 2022-11-22 NOTE — Telephone Encounter (Signed)
Pt informed and will do the self patch at home first before using

## 2022-12-02 ENCOUNTER — Encounter: Payer: Self-pay | Admitting: Allergy

## 2022-12-02 ENCOUNTER — Ambulatory Visit (INDEPENDENT_AMBULATORY_CARE_PROVIDER_SITE_OTHER): Payer: Medicare HMO | Admitting: Allergy

## 2022-12-02 ENCOUNTER — Other Ambulatory Visit: Payer: Self-pay

## 2022-12-02 VITALS — BP 140/82 | HR 83 | Temp 98.7°F | Ht 67.13 in | Wt 164.7 lb

## 2022-12-02 DIAGNOSIS — L508 Other urticaria: Secondary | ICD-10-CM | POA: Diagnosis not present

## 2022-12-02 DIAGNOSIS — T783XXD Angioneurotic edema, subsequent encounter: Secondary | ICD-10-CM

## 2022-12-02 DIAGNOSIS — H1010 Acute atopic conjunctivitis, unspecified eye: Secondary | ICD-10-CM

## 2022-12-02 DIAGNOSIS — J309 Allergic rhinitis, unspecified: Secondary | ICD-10-CM | POA: Diagnosis not present

## 2022-12-02 DIAGNOSIS — H1013 Acute atopic conjunctivitis, bilateral: Secondary | ICD-10-CM

## 2022-12-02 MED ORDER — FAMOTIDINE 20 MG PO TABS: 20.0000 mg | ORAL_TABLET | Freq: Two times a day (BID) | ORAL | 5 refills | Status: DC

## 2022-12-02 MED ORDER — CETIRIZINE HCL 10 MG PO TABS: 10.0000 mg | ORAL_TABLET | Freq: Two times a day (BID) | ORAL | 5 refills | Status: AC

## 2022-12-02 NOTE — Patient Instructions (Addendum)
-   at this time etiology of hives and swelling is most likely spontaneous in nature.  Hives can be caused by a variety of different triggers including illness/infection, foods, medications, stings, exercise, pressure, vibrations, extremes of temperature to name a few however majority of the time there is no identifiable trigger.    - you have had a reassuring workup thus far including blood cell counts, liver and kidney function, hereditary swelling panel, inflammatory marker, thyroid studies.    - environmental allergy panel was positive to dust mites, cockroach and tree pollen.  Avoidance measures provided.   - pork IgE was positive but low and may indicate you are sensitized (see below).  If you have not had symptoms after eating pork products then you are sensitized and can keep in diet.  If you have had symptoms after eating pork then you are allergic and should keep out of diet and have access to epipen.    - we have discussed the following in regards to foods:   Allergy: food allergy is when you have eaten a food, developed an allergic reaction after eating the food and have IgE to the food (positive food testing either by skin testing or blood testing).  Food allergy could lead to life threatening symptoms  Sensitivity: occurs when you have IgE to a food (positive food testing either by skin testing or blood testing) but is a food you eat without any issues.  This is not an allergy and we recommend keeping the food in the diet  Intolerance: this is when you have negative testing by either skin testing or blood testing thus not allergic but the food causes symptoms (like belly pain, bloating, diarrhea etc) with ingestion.  These foods should be avoided to prevent symptoms.     - continue high-dose antihistamine regimen for now with  Zyrtec 1 tab twice a day and Pepcid 1 tab twice a day  -If twice a day dosing is not effective enough will discuss options of setting up therapy with adding either  Singulair oral tablet and/or Xolair monthly injections   Follow-up in 6 months or sooner if needed

## 2022-12-02 NOTE — Progress Notes (Signed)
Follow-up Note  RE: Kelly TROUTT MRN: [id number] DOB: 08/11/1945 Date of Office Visit: 12/02/2022   History of present illness: Kelly Quinn is a 77 y.o. female presenting today for follow-up of chronic urticaria with angioedema and allergic rhinitis with conjunctivitis.  She was last seen in the office on 09/01/2022 by myself.  She has not had any major health changes, surgeries or hospitalizations since last visit.  She states she has not had any hives or swelling episodes in the past 8 months which she is very happy about.  She has been taking zyrtec twice a day and pepcid twice a day.  She is hesitant in regards to changing up this regimen as she does not want to have any lip swelling or other symptoms.    She states she does not really eat a lot of red meat products in the diet being on occasions.  She does not recall having any issues with pork in the past.  She does have an epinephrine device at this time.  Review of systems: 10pt ROS negative unless noted above in HPI  Past medical/social/surgical/family history have been reviewed and are unchanged unless specifically indicated below.  No changes  Medication List: Current Outpatient Medications  Medication Sig Dispense Refill   calcium gluconate 500 MG tablet Take 3 tablets by mouth daily.     Calcium-Phosphorus-Vitamin D (CALCIUM GUMMIES PO) Take 1,500 mg by mouth daily.     cetirizine (ZYRTEC) 10 MG tablet Take 1 tablet (10 mg total) by mouth 2 (two) times daily. 60 tablet 5   Cholecalciferol (VITAMIN D PO) Take by mouth. Take 2 gummies daily.     COLLAGEN PO Take 1 tablet by mouth daily.     Cyanocobalamin (VITAMIN B 12 PO) Take 1 tablet by mouth. Take 2 gummies daily     cyclobenzaprine (FLEXERIL) 10 MG tablet Oral     EPINEPHrine (EPIPEN 2-PAK) 0.3 mg/0.3 mL IJ SOAJ injection Inject 0.3 mg into the muscle as needed for anaphylaxis. 2 each 2   EPINEPHrine 0.3 mg/0.3 mL IJ SOAJ injection Injection     famciclovir  (FAMVIR) 125 MG tablet Take 125 mg by mouth as needed.       famciclovir (FAMVIR) 500 MG tablet      famotidine (PEPCID) 20 MG tablet Take 1 tablet (20 mg total) by mouth 2 (two) times daily. 60 tablet 5   finasteride (PROSCAR) 5 MG tablet Take by mouth.     fluocinonide ointment (LIDEX) 0.05 % Apply 1 Application topically as needed (itching).     fluticasone (FLONASE) 50 MCG/ACT nasal spray 2 sprays by Nasal route as needed.       hydrochlorothiazide (HYDRODIURIL) 12.5 MG tablet Take 12.5 mg by mouth as needed.     metFORMIN (GLUCOPHAGE) 500 MG tablet Take by mouth daily.     NON FORMULARY Vita fusion MVI gummie-Take 2 daily     Omega-3 Fatty Acids (SALMON OIL-1000 PO) Take 1,000 mg by mouth daily.     PSYLLIUM PO Take by mouth daily.     rosuvastatin (CRESTOR) 10 MG tablet Take by mouth.     triamcinolone cream (KENALOG) 0.1 % Apply topically 2 (two) times daily as needed. 30 g 1   UNABLE TO FIND Take 482 mg by mouth daily. Tumeric     Accu-Chek Softclix Lancets lancets SMARTSIG:Topical (Patient not taking: Reported on 12/02/2022)     Alcohol Swabs (DROPSAFE ALCOHOL PREP) 70 % PADS Apply topically. (Patient not  taking: Reported on 12/02/2022)     APPLE CIDER VINEGAR PO Take by mouth. 2 tbsp (Patient not taking: Reported on 09/01/2022)     cimetidine (CIMETIDINE 200) 200 MG tablet Take 1 tablet (200 mg total) by mouth 2 (two) times daily. (Patient not taking: Reported on 12/02/2022) 60 tablet 5   Collagen Hydrolysate POWD by Misc.(Non-Drug; Combo Route) route. (Patient not taking: Reported on 09/01/2022)     Ivermectin 1 % CREA Apply 1 Application topically daily. (Patient not taking: Reported on 12/02/2022)     NONFORMULARY OR COMPOUNDED ITEM Boric Acid 600 mg. Capsules #30 s: insert caps vaginally twice weekly. (Patient not taking: Reported on 09/01/2022) 30 each 1   No current facility-administered medications for this visit.     Known medication allergies: Allergies  Allergen Reactions    Asa [Aspirin]     Swelling in lips   Antiseptic Products, Misc.     Clorox and Lysol stops pt up/congestion/cough   Codeine     Nauseous, stomach pain   Darvon     Upset stomach   Levofloxacin Hives   Other Dermatitis    Dawn dish soap in peach, SPF/Sun    Physical examination: Blood pressure (!) 140/82, pulse 83, temperature 98.7 F (37.1 C), height 5' 7.13" (1.705 m), weight 164 lb 11.2 oz (74.7 kg), SpO2 98%.  General: Alert, interactive, in no acute distress. HEENT: PERRLA, TMs pearly gray, turbinates non-edematous without discharge, post-pharynx non erythematous. Neck: Supple without lymphadenopathy. Lungs: Clear to auscultation without wheezing, rhonchi or rales. {no increased work of breathing. CV: Normal S1, S2 without murmurs. Abdomen: Nondistended, nontender. Skin: Warm and dry, without lesions or rashes. Extremities:  No clubbing, cyanosis or edema. Neuro:   Grossly intact.  Diagnositics/Labs: Labs:  Component     Latest Ref Rng 09/01/2022  D Pteronyssinus IgE     Class 0/I kU/L 0.22 !   D Farinae IgE     Class 0/I kU/L 0.23 !   Cat Dander IgE     Class 0 kU/L <0.10   Dog Dander IgE     Class 0 kU/L <0.10   [place] Grass IgE     Class 0 kU/L <0.10   Timothy Grass IgE     Class 0 kU/L <0.10   [place] Grass IgE     Class 0 kU/L <0.10   Cockroach, German IgE     Class 0/I kU/L 0.16 !   Penicillium Chrysogen IgE     Class 0 kU/L <0.10   Cladosporium Herbarum IgE     Class 0 kU/L <0.10   Aspergillus Fumigatus IgE     Class 0 kU/L <0.10   Alternaria Alternata IgE     Class 0 kU/L <0.10   Maple/Box Elder IgE     Class 0 kU/L <0.10   Common Silver Charletta Cousin IgE     Class 0 kU/L <0.10   Cedar, [place] IgE     Class 0/I kU/L 0.11 !   Oak, White IgE     Class 0 kU/L <0.10   Elm, American IgE     Class 0 kU/L <0.10   [place] IgE     Class 0 kU/L <0.10   Pecan, Hickory IgE     Class 0 kU/L <0.10   White Mulberry IgE     Class 0 kU/L <0.10   Ragweed,  Short IgE     Class 0 kU/L <0.10   Pigweed, Rough IgE     Class 0 kU/L <  0.10   Sheep Sorrel IgE Qn     Class 0 kU/L <0.10   Mouse Urine IgE     Class 0 kU/L <0.10   Class Description Allergens Comment   IgE (Immunoglobulin E), Serum     6 - 495 IU/mL 498 (H)   [id number] Alpha-Gal     Class 0 kU/L <0.10   Beef IgE     Class 0 kU/L <0.10   Pork IgE     Class 0/I kU/L 0.23 !   Allergen Lamb IgE     Class 0 kU/L <0.10   cu index     <10  1.9   Tryptase     2.2 - 13.2 ug/L 6.1    Assessment and plan: Chronic urticaria with angioedema Allergic rhinitis with conjunctivitis - at this time etiology of hives and swelling is most likely spontaneous in nature.  Hives can be caused by a variety of different triggers including illness/infection, foods, medications, stings, exercise, pressure, vibrations, extremes of temperature to name a few however majority of the time there is no identifiable trigger.    - you have had a reassuring workup thus far including blood cell counts, liver and kidney function, hereditary swelling panel, inflammatory marker, thyroid studies.    - environmental allergy panel was positive to dust mites, cockroach and tree pollen.  Avoidance measures provided.   - pork IgE was positive but low and may indicate you are sensitized (see below).  If you have not had symptoms after eating pork products then you are sensitized and can keep in diet.  If you have had symptoms after eating pork then you are allergic and should keep out of diet and have access to epipen.    - we have discussed the following in regards to foods:   Allergy: food allergy is when you have eaten a food, developed an allergic reaction after eating the food and have IgE to the food (positive food testing either by skin testing or blood testing).  Food allergy could lead to life threatening symptoms  Sensitivity: occurs when you have IgE to a food (positive food testing either by skin testing or blood  testing) but is a food you eat without any issues.  This is not an allergy and we recommend keeping the food in the diet  Intolerance: this is when you have negative testing by either skin testing or blood testing thus not allergic but the food causes symptoms (like belly pain, bloating, diarrhea etc) with ingestion.  These foods should be avoided to prevent symptoms.     - continue high-dose antihistamine regimen for now with  Zyrtec 1 tab twice a day and Pepcid 1 tab twice a day  -If twice a day dosing is not effective enough will discuss options of setting up therapy with adding either Singulair oral tablet and/or Xolair monthly injections   Follow-up in 6 months or sooner if needed  I appreciate the opportunity to take part in Alante's care. Please do not hesitate to contact me with questions.  Sincerely,   Margo Aye, MD Allergy/Immunology [facility]

## 2023-01-05 DIAGNOSIS — L658 Other specified nonscarring hair loss: Secondary | ICD-10-CM | POA: Diagnosis not present

## 2023-01-05 DIAGNOSIS — L219 Seborrheic dermatitis, unspecified: Secondary | ICD-10-CM | POA: Diagnosis not present

## 2023-01-05 DIAGNOSIS — L249 Irritant contact dermatitis, unspecified cause: Secondary | ICD-10-CM | POA: Diagnosis not present

## 2023-01-11 DIAGNOSIS — M205X1 Other deformities of toe(s) (acquired), right foot: Secondary | ICD-10-CM | POA: Diagnosis not present

## 2023-01-11 DIAGNOSIS — M7732 Calcaneal spur, left foot: Secondary | ICD-10-CM | POA: Diagnosis not present

## 2023-01-11 DIAGNOSIS — L84 Corns and callosities: Secondary | ICD-10-CM | POA: Diagnosis not present

## 2023-01-11 DIAGNOSIS — E1151 Type 2 diabetes mellitus with diabetic peripheral angiopathy without gangrene: Secondary | ICD-10-CM | POA: Diagnosis not present

## 2023-01-11 DIAGNOSIS — B351 Tinea unguium: Secondary | ICD-10-CM | POA: Diagnosis not present

## 2023-01-11 DIAGNOSIS — M205X2 Other deformities of toe(s) (acquired), left foot: Secondary | ICD-10-CM | POA: Diagnosis not present

## 2023-01-11 DIAGNOSIS — I70203 Unspecified atherosclerosis of native arteries of extremities, bilateral legs: Secondary | ICD-10-CM | POA: Diagnosis not present

## 2023-01-11 DIAGNOSIS — D2372 Other benign neoplasm of skin of left lower limb, including hip: Secondary | ICD-10-CM | POA: Diagnosis not present

## 2023-01-11 DIAGNOSIS — D2371 Other benign neoplasm of skin of right lower limb, including hip: Secondary | ICD-10-CM | POA: Diagnosis not present

## 2023-01-25 DIAGNOSIS — L508 Other urticaria: Secondary | ICD-10-CM | POA: Diagnosis not present

## 2023-01-25 DIAGNOSIS — E7800 Pure hypercholesterolemia, unspecified: Secondary | ICD-10-CM | POA: Diagnosis not present

## 2023-01-25 DIAGNOSIS — L659 Nonscarring hair loss, unspecified: Secondary | ICD-10-CM | POA: Diagnosis not present

## 2023-01-25 DIAGNOSIS — J301 Allergic rhinitis due to pollen: Secondary | ICD-10-CM | POA: Diagnosis not present

## 2023-01-25 DIAGNOSIS — Z23 Encounter for immunization: Secondary | ICD-10-CM | POA: Diagnosis not present

## 2023-01-25 DIAGNOSIS — E1165 Type 2 diabetes mellitus with hyperglycemia: Secondary | ICD-10-CM | POA: Diagnosis not present

## 2023-01-25 DIAGNOSIS — I10 Essential (primary) hypertension: Secondary | ICD-10-CM | POA: Diagnosis not present

## 2023-01-31 DIAGNOSIS — L84 Corns and callosities: Secondary | ICD-10-CM | POA: Diagnosis not present

## 2023-01-31 DIAGNOSIS — E1151 Type 2 diabetes mellitus with diabetic peripheral angiopathy without gangrene: Secondary | ICD-10-CM | POA: Diagnosis not present

## 2023-01-31 DIAGNOSIS — M7732 Calcaneal spur, left foot: Secondary | ICD-10-CM | POA: Diagnosis not present

## 2023-01-31 DIAGNOSIS — I70203 Unspecified atherosclerosis of native arteries of extremities, bilateral legs: Secondary | ICD-10-CM | POA: Diagnosis not present

## 2023-01-31 DIAGNOSIS — M205X2 Other deformities of toe(s) (acquired), left foot: Secondary | ICD-10-CM | POA: Diagnosis not present

## 2023-01-31 DIAGNOSIS — D2371 Other benign neoplasm of skin of right lower limb, including hip: Secondary | ICD-10-CM | POA: Diagnosis not present

## 2023-01-31 DIAGNOSIS — B351 Tinea unguium: Secondary | ICD-10-CM | POA: Diagnosis not present

## 2023-01-31 DIAGNOSIS — M205X1 Other deformities of toe(s) (acquired), right foot: Secondary | ICD-10-CM | POA: Diagnosis not present

## 2023-01-31 DIAGNOSIS — D2372 Other benign neoplasm of skin of left lower limb, including hip: Secondary | ICD-10-CM | POA: Diagnosis not present

## 2023-02-17 DIAGNOSIS — L03012 Cellulitis of left finger: Secondary | ICD-10-CM | POA: Diagnosis not present

## 2023-03-10 DIAGNOSIS — R0982 Postnasal drip: Secondary | ICD-10-CM | POA: Diagnosis not present

## 2023-03-10 DIAGNOSIS — Z6825 Body mass index (BMI) 25.0-25.9, adult: Secondary | ICD-10-CM | POA: Diagnosis not present

## 2023-03-10 DIAGNOSIS — R058 Other specified cough: Secondary | ICD-10-CM | POA: Diagnosis not present

## 2023-04-14 DIAGNOSIS — M205X2 Other deformities of toe(s) (acquired), left foot: Secondary | ICD-10-CM | POA: Diagnosis not present

## 2023-04-14 DIAGNOSIS — D2371 Other benign neoplasm of skin of right lower limb, including hip: Secondary | ICD-10-CM | POA: Diagnosis not present

## 2023-04-14 DIAGNOSIS — E1151 Type 2 diabetes mellitus with diabetic peripheral angiopathy without gangrene: Secondary | ICD-10-CM | POA: Diagnosis not present

## 2023-04-14 DIAGNOSIS — D2372 Other benign neoplasm of skin of left lower limb, including hip: Secondary | ICD-10-CM | POA: Diagnosis not present

## 2023-04-14 DIAGNOSIS — I70203 Unspecified atherosclerosis of native arteries of extremities, bilateral legs: Secondary | ICD-10-CM | POA: Diagnosis not present

## 2023-04-14 DIAGNOSIS — M7732 Calcaneal spur, left foot: Secondary | ICD-10-CM | POA: Diagnosis not present

## 2023-04-14 DIAGNOSIS — M205X1 Other deformities of toe(s) (acquired), right foot: Secondary | ICD-10-CM | POA: Diagnosis not present

## 2023-04-14 DIAGNOSIS — B351 Tinea unguium: Secondary | ICD-10-CM | POA: Diagnosis not present

## 2023-04-14 DIAGNOSIS — L84 Corns and callosities: Secondary | ICD-10-CM | POA: Diagnosis not present

## 2023-04-28 DIAGNOSIS — H04123 Dry eye syndrome of bilateral lacrimal glands: Secondary | ICD-10-CM | POA: Diagnosis not present

## 2023-04-28 DIAGNOSIS — E119 Type 2 diabetes mellitus without complications: Secondary | ICD-10-CM | POA: Diagnosis not present

## 2023-04-28 DIAGNOSIS — Z961 Presence of intraocular lens: Secondary | ICD-10-CM | POA: Diagnosis not present

## 2023-04-28 DIAGNOSIS — H524 Presbyopia: Secondary | ICD-10-CM | POA: Diagnosis not present

## 2023-05-26 ENCOUNTER — Other Ambulatory Visit (HOSPITAL_COMMUNITY): Payer: Self-pay | Admitting: Cardiology

## 2023-05-26 DIAGNOSIS — Z8249 Family history of ischemic heart disease and other diseases of the circulatory system: Secondary | ICD-10-CM

## 2023-05-29 DIAGNOSIS — D2371 Other benign neoplasm of skin of right lower limb, including hip: Secondary | ICD-10-CM | POA: Diagnosis not present

## 2023-05-29 DIAGNOSIS — L720 Epidermal cyst: Secondary | ICD-10-CM | POA: Diagnosis not present

## 2023-05-29 DIAGNOSIS — L718 Other rosacea: Secondary | ICD-10-CM | POA: Diagnosis not present

## 2023-05-29 DIAGNOSIS — L649 Androgenic alopecia, unspecified: Secondary | ICD-10-CM | POA: Diagnosis not present

## 2023-05-29 DIAGNOSIS — D2271 Melanocytic nevi of right lower limb, including hip: Secondary | ICD-10-CM | POA: Diagnosis not present

## 2023-05-29 DIAGNOSIS — L821 Other seborrheic keratosis: Secondary | ICD-10-CM | POA: Diagnosis not present

## 2023-05-31 ENCOUNTER — Ambulatory Visit (HOSPITAL_COMMUNITY)
Admission: RE | Admit: 2023-05-31 | Discharge: 2023-05-31 | Disposition: A | Discharge status: Home / Self Care | Payer: Self-pay | Source: Ambulatory Visit | Attending: Cardiology | Admitting: Cardiology

## 2023-05-31 DIAGNOSIS — Z8249 Family history of ischemic heart disease and other diseases of the circulatory system: Secondary | ICD-10-CM

## 2023-06-01 ENCOUNTER — Other Ambulatory Visit (HOSPITAL_COMMUNITY): Payer: Self-pay | Admitting: *Deleted

## 2023-06-01 DIAGNOSIS — I2510 Atherosclerotic heart disease of native coronary artery without angina pectoris: Secondary | ICD-10-CM

## 2023-06-02 ENCOUNTER — Ambulatory Visit: Payer: Medicare HMO | Admitting: Gastroenterology

## 2023-06-02 ENCOUNTER — Encounter: Payer: Self-pay | Admitting: Gastroenterology

## 2023-06-02 VITALS — BP 134/68 | HR 92 | Ht 66.0 in | Wt 163.0 lb

## 2023-06-02 DIAGNOSIS — R142 Eructation: Secondary | ICD-10-CM

## 2023-06-02 DIAGNOSIS — R140 Abdominal distension (gaseous): Secondary | ICD-10-CM

## 2023-06-02 DIAGNOSIS — R194 Change in bowel habit: Secondary | ICD-10-CM | POA: Diagnosis not present

## 2023-06-02 NOTE — Progress Notes (Signed)
[facility] Consult Note:  History: Kelly Quinn 06/02/2023  Referring provider: Noberto Retort, MD  Reason for consult/chief complaint: Gastroesophageal Reflux (Indigestion is not as bad since she had a BM due to some constipation) and Gas (Having belching every time she eats x 4 weeks)   Subjective  Prior history:  Former patient of Dr. Russella Dar for screening and surveillance colonoscopies.  Adenomatous polyps in 2018, and 3 subcentimeter tubular adenomas June 2023-3-year recall recommended.  _________________    In the last couple of months, Chevie had an episode of constipation with no BM for about 2 days, she felt bloated and uncomfortable until was relieved with Dulcolax.  After that she continued to have some belching which has occurred often after meals and been ongoing since then.  She picked up some probiotics and thought she might try those, and started eating some yogurt in the last few days.  She also got a magnesium supplement because she read that might be helpful, and has a number of other vitamins and supplements. Denies dysphagia, odynophagia, nausea vomiting loss of appetite or weight loss. Bowels generally regular, but sometimes might skip a day.  Sometimes feels the need for BM but nothing will happen until later in the day.  No rectal bleeding. Other than the acute constipation, there was no other apparent GI illness precipitating this belching.  ROS:  Review of Systems  Constitutional:  Negative for appetite change and unexpected weight change.  HENT:  Negative for mouth sores and voice change.   Eyes:  Negative for pain and redness.  Respiratory:  Negative for cough and shortness of breath.   Cardiovascular:  Negative for chest pain and palpitations.  Genitourinary:  Negative for dysuria and hematuria.  Musculoskeletal:  Negative for arthralgias and myalgias.  Skin:  Negative for pallor and rash.  Neurological:  Negative for weakness  and headaches.  Hematological:  Negative for adenopathy.     Past Medical History: Past Medical History:  Diagnosis Date   Acid reflux    Allergy    Angio-edema    Arthritis    Bil knees   Hypertension    Osteoporosis 08/2017   T score -2.2 prior 2017 T score -2.6    Post-operative nausea and vomiting    Urticaria      Past Surgical History: Past Surgical History:  Procedure Laterality Date   CATARACT EXTRACTION, BILATERAL     COLONOSCOPY     DIAGNOSTIC LAPAROSCOPY  11/2015   Pt unsure of this surg!/torn cartilage in left knee   ELBOW SURGERY     right elbow   KNEE SURGERY     Arthroscopic   SALIVARY STONE REMOVAL  09/2012   wart removed under eye       Family History: Family History  Problem Relation Age of Onset   Allergic rhinitis Mother    Hypertension Mother    Allergic rhinitis Sister    Hypertension Sister    Lymphoma Sister    Colon polyps Sister    Breast cancer Sister    Stroke Sister    Colon polyps Brother    Diabetes Brother    Kidney disease Brother    Diabetes Brother    Colon cancer Neg Hx    Esophageal cancer Neg Hx    Stomach cancer Neg Hx    Rectal cancer Neg Hx     Social History: Social History   Socioeconomic History   Marital status: Widowed    Spouse  name: Not on file   Number of children: 0   Years of education: Not on file   Highest education level: Not on file  Occupational History   Not on file  Tobacco Use   Smoking status: Never    Passive exposure: Past   Smokeless tobacco: Never  Vaping Use   Vaping status: Never Used  Substance and Sexual Activity   Alcohol use: No    Alcohol/week: 0.0 standard drinks of alcohol   Drug use: No   Sexual activity: Not Currently    Birth control/protection: Post-menopausal    Comment: 1st intercourse 78 yo-Fewer than 5 partners  Other Topics Concern   Not on file  Social History Narrative   Have an adopted daughter   Social Drivers of Corporate investment banker  Strain: Not on file  Food Insecurity: Not on file  Transportation Needs: Not on file  Physical Activity: Not on file  Stress: Not on file  Social Connections: Not on file    Allergies: Allergies  Allergen Reactions   Asa [Aspirin]     Swelling in lips   Antiseptic Products, Misc.     Clorox and Lysol stops pt up/congestion/cough   Codeine     Nauseous, stomach pain   Darvon     Upset stomach   Levofloxacin Hives   Clarithromycin     Other Reaction(s): Unknown   Other Dermatitis    Dawn dish soap in peach, SPF/Sun    Outpatient Meds: Current Outpatient Medications  Medication Sig Dispense Refill   CALCIUM PO Take 2,000 mg by mouth daily.     cetirizine (ZYRTEC) 10 MG tablet Take 1 tablet (10 mg total) by mouth 2 (two) times daily. 60 tablet 5   COLLAGEN PO Take 1 tablet by mouth daily.     Collagen-Vitamin C-Biotin (COLLAGEN PO) Take 1 tablet by mouth daily.     Cyanocobalamin (VITAMIN B 12 PO) Take 5,000 mcg by mouth daily. Take 2 gummies daily     cyclobenzaprine (FLEXERIL) 10 MG tablet Oral     famciclovir (FAMVIR) 125 MG tablet Take 125 mg by mouth as needed.       famotidine (PEPCID) 20 MG tablet Take 1 tablet (20 mg total) by mouth 2 (two) times daily. 60 tablet 5   finasteride (PROSCAR) 5 MG tablet Take by mouth.     fluocinonide ointment (LIDEX) 0.05 % Apply 1 Application topically as needed (itching).     fluticasone (FLONASE) 50 MCG/ACT nasal spray 2 sprays by Nasal route as needed.       hydrochlorothiazide (HYDRODIURIL) 12.5 MG tablet Take 12.5 mg by mouth as needed.     Ivermectin 1 % CREA Apply 1 Application topically daily.     MAGNESIUM CITRATE PO Take 350 mg by mouth daily.     metFORMIN (GLUCOPHAGE) 500 MG tablet Take by mouth daily. 2 tablets by mouth in the morning and 1 at night     Multiple Vitamin (MULTIVITAMIN) tablet Take 1 tablet by mouth daily.     Multiple Vitamins-Minerals (IMMUNE SUPPORT) CHEW Chew 1 tablet by mouth daily.     Probiotic  Product (PROBIOTIC PO) Take 1 capsule by mouth daily.     PSYLLIUM PO Take by mouth daily.     TURMERIC PO Take 1 capsule by mouth daily.     Vitamin D-Vitamin K (VITAMIN K2-VITAMIN D3 PO) Take 2,000 mg by mouth daily.     EPINEPHrine (EPIPEN 2-PAK) 0.3 mg/0.3 mL IJ SOAJ  injection Inject 0.3 mg into the muscle as needed for anaphylaxis. (Patient not taking: Reported on 06/02/2023) 2 each 2   NONFORMULARY OR COMPOUNDED ITEM Boric Acid 600 mg. Capsules #30 s: insert caps vaginally twice weekly. (Patient not taking: Reported on 09/01/2022) 30 each 1   rosuvastatin (CRESTOR) 10 MG tablet Take by mouth.     No current facility-administered medications for this visit.   Note-the H2 blockers for allergies not GI   ___________________________________________________________________ Objective   Exam:  BP 134/68 (BP Location: Left Arm, Patient Position: Sitting, Cuff Size: Normal)   Pulse 92   Ht 5\' 6"  (1.676 m)   Wt 163 lb (73.9 kg)   BMI 26.31 kg/m  Wt Readings from Last 3 Encounters:  06/02/23 163 lb (73.9 kg)  12/02/22 164 lb 11.2 oz (74.7 kg)  09/01/22 166 lb 1.6 oz (75.3 kg)    General: Looks well, normal muscle mass Eyes: sclera anicteric, no redness ENT: oral mucosa moist without lesions, no cervical or supraclavicular lymphadenopathy CV: Regular with soft systolic murmur, no JVD, no peripheral edema Resp: clear to auscultation bilaterally, normal RR and effort noted GI: soft, no tenderness, with active bowel sounds. No guarding or palpable organomegaly noted. Skin; warm and dry, no rash or jaundice noted Neuro: awake, alert and oriented x 3. Normal gross motor function and fluent speech   No recent data  Acute constipation episode resolved.  Occasional irregularity.  She asked about stool softeners, and I think that sounds reasonable. She also reports gassiness and bloating with certain foods including cabbage.  Sounds like she has some element of maldigestion, and some  written dietary advice was given.  Belching, sounds benign without red flag upper GI symptoms.  Not clear what triggered it.  Often supra gastric belching with aerophagia, sometimes postinfectious upper GI dysmotility.  No particular treatment offered at this point.   Lab, imaging or radiologic testing seems likely be low yield at this juncture.  I however encouraged her to contact us if she develops abdominal pain, loss of appetite, dysphagia, weight loss or other symptoms of concern.    Thank you for the courtesy of this consult.  Please call me with any questions or concerns.  Charlie Pitter III  CC: Referring provider noted above

## 2023-06-02 NOTE — Patient Instructions (Addendum)
_______________________________________________________  Food Guidelines for those with chronic digestive trouble:  Many people have difficulty digesting certain foods, causing a variety of distressing and embarrassing symptoms such as abdominal pain, bloating and gas.  These foods may need to be avoided or consumed in small amounts.  Here are some tips that might be helpful for you.  1.   Lactose intolerance is the difficulty or complete inability to digest lactose, the natural sugar in milk and anything made from milk.  This condition is harmless, common, and can begin any time during life.  Some people can digest a modest amount of lactose while others cannot tolerate any.  Also, not all dairy products contain equal amounts of lactose.  For example, hard cheeses such as parmesan have less lactose than soft cheeses such as cheddar.  Yogurt has less lactose than milk or cheese.  Many packaged foods (even many brands of bread) have milk, so read ingredient lists carefully.  It is difficult to test for lactose intolerance, so just try avoiding lactose as much as possible for a week and see what happens with your symptoms.  If you seem to be lactose intolerant, the best plan is to avoid it (but make sure you get calcium from another source).  The next best thing is to use lactase enzyme supplements, available over the counter everywhere.  Just know that many lactose intolerant people need to take several tablets with each serving of dairy to avoid symptoms.  Lastly, a lot of restaurant food is made with milk or butter.  Many are things you might not suspect, such as mashed potatoes, rice and pasta (cooked with butter) and "grilled" items.  If you are lactose intolerant, it never hurts to ask your server what has milk or butter.  2.   Fiber is an important part of your diet, but not all fiber is well-tolerated.  Insoluble fiber such as bran is often consumed by normal gut bacteria and converted into gas.   Soluble fiber such as oats, squash, carrots and green beans are typically tolerated better.  3.   Some types of carbohydrates can be poorly digested.  Examples include: fructose (apples, cherries, pears, raisins and other dried fruits), fructans (onions, zucchini, large amounts of wheat), sorbitol/mannitol/xylitol and sucralose/Splenda (common artificial sweeteners), and raffinose (lentils, broccoli, cabbage, asparagus, brussel sprouts, many types of beans).  Do a Programmer, multimedia for [place] and you will find helpful information. Beano, a dietary supplement, will often help with raffinose-containing foods.  As with lactase tablets, you may need several per serving.  4.   Whenever possible, avoid processed food&meats and chemical additives.  High fructose corn syrup, a common sweetener, may be difficult to digest.  Eggs and soy (comes from the soybean, and added to many foods now) are other common bloating/gassy foods.  5.  Regarding gluten:  gluten is a protein mainly found in wheat, but also rye and barley.  There is a condition called celiac sprue, which is an inflammatory reaction in the small intestine causing a variety of digestive symptoms.  Blood testing is highly reliable to look for this condition, and sometimes upper endoscopy with small bowel biopsies may be necessary to make the diagnosis.  Many patients who test negative for celiac sprue report improvement in their digestive symptoms when they switch to a gluten-free diet.  However, in these "non-celiac gluten sensitive" patients, the true role of gluten in their symptoms is unclear.  Reducing carbohydrates in general may decrease the gas  and bloating caused when gut bacteria consume carbs. Also, some of these patients may actually be intolerant of the baker's yeast in bread products rather than the gluten.  Flatbread and other reduced yeast breads might therefore be tolerated.  There is no specific testing available for most food intolerances,  which are discovered mainly by dietary elimination.  Please do not embark on a gluten free diet unless directed by your doctor, as it is highly restrictive, and may lead to nutritional deficiencies if not carefully monitored.  Lastly, beware of internet claims offering "personalized" tests for food intolerances.  Such testing has no reliable scientific evidence to support its reliability and correlation to symptoms.    6.  The best advice is old advice, especially for those with chronic digestive trouble - try to eat "clean".  Balanced diet, avoid processed food, plenty of fruits and vegetables, cut down the sugar, minimal alcohol, avoid tobacco. Make time to care for yourself, get enough sleep, exercise when you can, reduce stress.  Your guts will thank you for it.   - Dr. [facility]  ____________________________________________________________  _______________________________________________________  If your blood pressure at your visit was 140/90 or greater, please contact your primary care physician to follow up on this.  _______________________________________________________  If you are age 76 or older, your body mass index should be between 23-30. Your Body mass index is 26.31 kg/m. If this is out of the aforementioned range listed, please consider follow up with your Primary Care Provider.  If you are age 4 or younger, your body mass index should be between 19-25. Your Body mass index is 26.31 kg/m. If this is out of the aformentioned range listed, please consider follow up with your Primary Care Provider.   ________________________________________________________  The [facility] providers would like to encourage you to use [facility] to communicate with providers for non-urgent requests or questions.  Due to long hold times on the telephone, sending your provider a message by [facility] may be a faster and more efficient way to get a response.  Please allow 48  business hours for a response.  Please remember that this is for non-urgent requests.  _______________________________________________________ It was a pleasure to see you today!  Thank you for trusting me with your gastrointestinal care!

## 2023-06-05 ENCOUNTER — Ambulatory Visit (HOSPITAL_COMMUNITY)
Admission: RE | Admit: 2023-06-05 | Discharge: 2023-06-05 | Disposition: A | Discharge status: Home / Self Care | Payer: Self-pay | Source: Ambulatory Visit | Attending: Cardiology | Admitting: Cardiology

## 2023-06-05 DIAGNOSIS — I2510 Atherosclerotic heart disease of native coronary artery without angina pectoris: Secondary | ICD-10-CM | POA: Insufficient documentation

## 2023-06-07 ENCOUNTER — Ambulatory Visit: Payer: Medicare HMO | Admitting: Allergy

## 2023-06-14 ENCOUNTER — Ambulatory Visit (INDEPENDENT_AMBULATORY_CARE_PROVIDER_SITE_OTHER): Payer: Medicare HMO | Admitting: Podiatry

## 2023-06-14 ENCOUNTER — Encounter: Payer: Self-pay | Admitting: Podiatry

## 2023-06-14 DIAGNOSIS — M79674 Pain in right toe(s): Secondary | ICD-10-CM

## 2023-06-14 DIAGNOSIS — E119 Type 2 diabetes mellitus without complications: Secondary | ICD-10-CM

## 2023-06-14 DIAGNOSIS — B351 Tinea unguium: Secondary | ICD-10-CM | POA: Diagnosis not present

## 2023-06-14 DIAGNOSIS — M79675 Pain in left toe(s): Secondary | ICD-10-CM

## 2023-06-14 NOTE — Progress Notes (Signed)
 Subjective:   Patient ID: Kelly Quinn, female   DOB: 78 y.o.   MRN: [id number]   HPI Patient presents concerned about diabetes and nail disease and states that they are hard for her to cut and she wants to make sure it safe to do.  Patient does not smoke likes to be active   Review of Systems  All other systems reviewed and are negative.       Objective:  Physical Exam Vitals and nursing note reviewed.  Constitutional:      Appearance: She is well-developed.  Pulmonary:     Effort: Pulmonary effort is normal.  Musculoskeletal:        General: Normal range of motion.  Skin:    General: Skin is warm.  Neurological:     Mental Status: She is alert.     Neurovascular status was found to be intact currently muscle strength adequate range of motion adequate and does not appear to have any current sequela from being diagnosed with diabetes.  Moderate elongation nailbeds 1-5 both feet with thickness and mild discomfort with good digital perfusion well-oriented     Assessment:  Michael nail infection bilateral with mild tenderness and diabetes under good control no sequela     Plan:  H&P discussed diabetes debrided nailbeds 1-5 both feet and advised that I think this can be done safely with pedicure given where she is at.  She will do this and if she needs to see she will see [place] but currently I think she is safe with pedicures given her current situation and will pursue

## 2023-06-29 DIAGNOSIS — M25511 Pain in right shoulder: Secondary | ICD-10-CM | POA: Diagnosis not present

## 2023-06-29 DIAGNOSIS — M542 Cervicalgia: Secondary | ICD-10-CM | POA: Diagnosis not present

## 2023-07-07 ENCOUNTER — Telehealth (INDEPENDENT_AMBULATORY_CARE_PROVIDER_SITE_OTHER): Payer: Self-pay | Admitting: Otolaryngology

## 2023-07-07 NOTE — Telephone Encounter (Signed)
 LVM to confirm appt & location [id number] afm

## 2023-07-10 ENCOUNTER — Encounter (INDEPENDENT_AMBULATORY_CARE_PROVIDER_SITE_OTHER): Payer: Self-pay

## 2023-07-10 ENCOUNTER — Ambulatory Visit (INDEPENDENT_AMBULATORY_CARE_PROVIDER_SITE_OTHER): Payer: Medicare HMO

## 2023-07-10 VITALS — BP 126/74 | HR 86 | Ht 67.5 in | Wt 163.0 lb

## 2023-07-10 DIAGNOSIS — J3481 Nasal mucositis (ulcerative): Secondary | ICD-10-CM | POA: Diagnosis not present

## 2023-07-10 MED ORDER — MUPIROCIN 2 % EX OINT: 1.0000 | TOPICAL_OINTMENT | Freq: Three times a day (TID) | CUTANEOUS | 2 refills | Status: AC

## 2023-07-12 DIAGNOSIS — J3481 Nasal mucositis (ulcerative): Secondary | ICD-10-CM | POA: Insufficient documentation

## 2023-07-12 NOTE — Progress Notes (Signed)
 Patient ID: Kelly Quinn, female   DOB: February 29, 1946, 78 y.o.   MRN: [id number]  New complaint: Left nasal pain  HPI: The patient is a 78 year old female who presents today with a new complaint of intermittent left nasal pain.  She complains of burning and soreness within her left nostril over the past few months.  She also has occasional nasal congestion.  She denies any fever or visual change.  The patient was previously seen for bilateral tinnitus and hearing loss.  Exam: General: Communicates without difficulty, well nourished, no acute distress. Head: Normocephalic, no evidence injury, no tenderness, facial buttresses intact without stepoff. Face/sinus: No tenderness to palpation and percussion. Facial movement is normal and symmetric. Eyes: PERRL, EOMI. No scleral icterus, conjunctivae clear. Neuro: CN II exam reveals vision grossly intact.  No nystagmus at any point of gaze. Ears: Auricles well formed without lesions.  Ear canals are intact without mass or lesion.  No erythema or edema is appreciated.  The TMs are intact without fluid. Nose: External evaluation reveals normal support and skin without lesions.  Dorsum is intact.  Anterior rhinoscopy reveals congested mucosa over anterior aspect of inferior turbinates and intact septum.  Left nasal impetigo noted.  Oral:  Oral cavity and oropharynx are intact, symmetric, without erythema or edema.  Mucosa is moist without lesions. Neck: Full range of motion without pain.  There is no significant lymphadenopathy.  No masses palpable.  Thyroid bed within normal limits to palpation.  Parotid glands and submandibular glands equal bilaterally without mass.  Trachea is midline. Neuro:  CN 2-12 grossly intact.   Assessment: 1.  Recurrent left nasal impetigo. 2.  The rest of her ENT exam is unremarkable.  Plan: 1.  The physical exam findings are reviewed with the patient. 2.  Bactroban ointment to treat the nasal impetigo. 3.  The patient is encouraged  to call with any questions or concerns.

## 2023-07-14 DIAGNOSIS — S161XXD Strain of muscle, fascia and tendon at neck level, subsequent encounter: Secondary | ICD-10-CM | POA: Diagnosis not present

## 2023-07-19 DIAGNOSIS — S161XXD Strain of muscle, fascia and tendon at neck level, subsequent encounter: Secondary | ICD-10-CM | POA: Diagnosis not present

## 2023-07-24 DIAGNOSIS — S161XXD Strain of muscle, fascia and tendon at neck level, subsequent encounter: Secondary | ICD-10-CM | POA: Diagnosis not present

## 2023-07-25 DIAGNOSIS — M25511 Pain in right shoulder: Secondary | ICD-10-CM | POA: Diagnosis not present

## 2023-07-28 ENCOUNTER — Encounter: Payer: Self-pay | Admitting: Allergy

## 2023-07-28 ENCOUNTER — Other Ambulatory Visit: Payer: Self-pay

## 2023-07-28 ENCOUNTER — Ambulatory Visit (INDEPENDENT_AMBULATORY_CARE_PROVIDER_SITE_OTHER): Payer: Medicare HMO | Admitting: Allergy

## 2023-07-28 VITALS — BP 138/74 | HR 78 | Temp 98.3°F | Resp 16

## 2023-07-28 DIAGNOSIS — H1013 Acute atopic conjunctivitis, bilateral: Secondary | ICD-10-CM

## 2023-07-28 DIAGNOSIS — L249 Irritant contact dermatitis, unspecified cause: Secondary | ICD-10-CM

## 2023-07-28 DIAGNOSIS — J309 Allergic rhinitis, unspecified: Secondary | ICD-10-CM | POA: Diagnosis not present

## 2023-07-28 DIAGNOSIS — L508 Other urticaria: Secondary | ICD-10-CM | POA: Diagnosis not present

## 2023-07-28 DIAGNOSIS — H1010 Acute atopic conjunctivitis, unspecified eye: Secondary | ICD-10-CM

## 2023-07-28 MED ORDER — FAMOTIDINE 20 MG PO TABS: 20.0000 mg | ORAL_TABLET | Freq: Two times a day (BID) | ORAL | 5 refills | Status: DC

## 2023-07-28 MED ORDER — OPZELURA 1.5 % EX CREA: TOPICAL_CREAM | CUTANEOUS | Status: DC

## 2023-07-28 NOTE — Progress Notes (Signed)
 Medication Samples have been provided to the patient.  Drug name: Opzelura       Strength: 1.5%        Qty: 2  LOT: [id number]  Exp.Date: 05/18/24  Dosing instructions: Use a thin layer on ear that is red, bumpy, or itchy twice a day as needed.  The patient has been instructed regarding the correct time, dose, and frequency of taking this medication, including desired effects and most common side effects.   Glena Norfolk 10:49 AM 07/28/2023

## 2023-07-28 NOTE — Progress Notes (Signed)
 Follow-up Note  RE: Kelly Quinn MRN: [id number] DOB: [birth year] Date of Office Visit: 07/28/2023   History of present illness: Kelly Quinn is a 78 y.o. female presenting today for follow-up of chronic urticaria, angioedema and allergic rhinitis with conjunctivitis.  She was last seen in the office on 12/02/22 by myself.  Discussed the use of AI scribe software for clinical note transcription with the patient, who gave verbal consent to proceed.  She has been experiencing adverse effects after an increase in her metformin dosage, which was adjusted by her primary care physician. She was feeling unwell, lacking energy, and experienced gastrointestinal symptoms such as upset stomach and bloating. These symptoms have affected her daily activities, including her exercise routine. After reducing the metformin dose on her own, she noticed an improvement in her symptoms.  She plans to talk with her PCP at upcoming visit about metformin dose.   She has a history of allergies and manages them with Zyrtec once daily and Flonase. She recently stopped taking famotidine and has not experienced any hives or swelling since August. She mentions an irritation behind her left ear, described as red and bumpy, and has been applying hydrocortisone to the area that's not really helping. She also experiences itching and stinging in her left nostril, which she attributes to allergies. She experiences hoarseness, particularly at night, which she attributes to allergies.  She has a history of hearing loss and has previously undergone audiology testing, which confirmed partial hearing loss. She is considering obtaining hearing aids to address this issue.  She does see ENT.     Review of systems: 10pt ROS negative unless noted above in HPI   Past medical/social/surgical/family history have been reviewed and are unchanged unless specifically indicated below.  No changes  Medication List: Current Outpatient  Medications  Medication Sig Dispense Refill   CALCIUM PO Take 2,000 mg by mouth daily.     cetirizine (ZYRTEC) 10 MG tablet Take 1 tablet (10 mg total) by mouth 2 (two) times daily. 60 tablet 5   COLLAGEN PO Take 1 tablet by mouth daily.     Collagen-Vitamin C-Biotin (COLLAGEN PO) Take 1 tablet by mouth daily.     Cyanocobalamin (VITAMIN B 12 PO) Take 5,000 mcg by mouth daily. Take 2 gummies daily     cyclobenzaprine (FLEXERIL) 10 MG tablet Oral     EPINEPHrine (EPIPEN 2-PAK) 0.3 mg/0.3 mL IJ SOAJ injection Inject 0.3 mg into the muscle as needed for anaphylaxis. 2 each 2   famciclovir (FAMVIR) 125 MG tablet Take 125 mg by mouth as needed.       famotidine (PEPCID) 20 MG tablet Take 1 tablet (20 mg total) by mouth 2 (two) times daily. 60 tablet 5   finasteride (PROSCAR) 5 MG tablet Take by mouth.     fluocinonide ointment (LIDEX) 0.05 % Apply 1 Application topically as needed (itching).     fluticasone (FLONASE) 50 MCG/ACT nasal spray 2 sprays by Nasal route as needed.       hydrochlorothiazide (HYDRODIURIL) 12.5 MG tablet Take 12.5 mg by mouth as needed.     Ivermectin 1 % CREA Apply 1 Application topically daily.     MAGNESIUM CITRATE PO Take 350 mg by mouth daily.     metFORMIN (GLUCOPHAGE) 500 MG tablet Take by mouth daily. 2 tablets by mouth in the morning and 1 at night     Multiple Vitamin (MULTIVITAMIN) tablet Take 1 tablet by mouth daily.  Multiple Vitamins-Minerals (IMMUNE SUPPORT) CHEW Chew 1 tablet by mouth daily.     NONFORMULARY OR COMPOUNDED ITEM Boric Acid 600 mg. Capsules #30 s: insert caps vaginally twice weekly. 30 each 1   Omega-3 Fatty Acids (OMEGA 3 PO) Take by mouth.     Probiotic Product (PROBIOTIC PO) Take 1 capsule by mouth daily.     PSYLLIUM PO Take by mouth daily.     rosuvastatin (CRESTOR) 10 MG tablet Take by mouth.     TURMERIC PO Take 1 capsule by mouth daily.     Vitamin D-Vitamin K (VITAMIN K2-VITAMIN D3 PO) Take 2,000 mg by mouth daily.     No  current facility-administered medications for this visit.     Known medication allergies: Allergies  Allergen Reactions   Asa [Aspirin]     Swelling in lips   Antiseptic Products, Misc.     Clorox and Lysol stops pt up/congestion/cough   Codeine     Nauseous, stomach pain   Darvon     Upset stomach   Levofloxacin Hives   Clarithromycin     Other Reaction(s): Unknown   Other Dermatitis    Dawn dish soap in peach, SPF/Sun     Physical examination: Blood pressure 138/74, pulse 78, temperature 98.3 F (36.8 C), temperature source Temporal, resp. rate 16, SpO2 99%.  General: Alert, interactive, in no acute distress. HEENT: PERRLA, TMs pearly gray, turbinates non-edematous without discharge, post-pharynx non erythematous. Neck: Supple without lymphadenopathy. Lungs: Clear to auscultation without wheezing, rhonchi or rales. {no increased work of breathing. CV: Normal S1, S2 without murmurs. Abdomen: Nondistended, nontender. Skin: Left ear pinna with erythematous papules at tip . Extremities:  No clubbing, cyanosis or edema. Neuro:   Grossly intact.  Diagnositics/Labs: None today  Assessment and plan: Chronic urticaria with angioedema Allergic rhinitis with conjunctivitis - doing well from hive and swelling standpoint.  - at this time etiology of hives and swelling is most likely spontaneous in nature.  Hives can be caused by a variety of different triggers including illness/infection, foods, medications, stings, exercise, pressure, vibrations, extremes of temperature to name a few however majority of the time there is no identifiable trigger.    - you have had a reassuring workup thus far including blood cell counts, liver and kidney function, hereditary swelling panel, inflammatory marker, thyroid studies.    - environmental allergy panel was positive to dust mites, cockroach and tree pollen.  Avoidance measures provided.   - pork IgE was positive but low and may indicate you  are sensitized (see below).  If you have not had symptoms after eating pork products then you are sensitized and can keep in diet.  If you have had symptoms after eating pork then you are allergic and should keep out of diet and have access to epipen.    - we have discussed the following in regards to foods:   Allergy: food allergy is when you have eaten a food, developed an allergic reaction after eating the food and have IgE to the food (positive food testing either by skin testing or blood testing).  Food allergy could lead to life threatening symptoms  Sensitivity: occurs when you have IgE to a food (positive food testing either by skin testing or blood testing) but is a food you eat without any issues.  This is not an allergy and we recommend keeping the food in the diet  Intolerance: this is when you have negative testing by either skin testing or blood  testing thus not allergic but the food causes symptoms (like belly pain, bloating, diarrhea etc) with ingestion.  These foods should be avoided to prevent symptoms.    - at this time continue with Zyrtec 1 tab daily.   If you have return of itch, hives or swelling then add back in Famotidine 1 tab twice a day dosing.   Dermatitis, lt ear - itchy rash on left ear likely eczematous in nature.  Provided with Opzelura samples today to use thin layer on ear that it red, bumpy or itchy twice a day as needed.    Follow-up in 6 months or sooner if needed  I appreciate the opportunity to take part in Karlin's care. Please do not hesitate to contact me with questions.  Sincerely,   Margo Aye, MD Allergy/Immunology [facility]

## 2023-07-28 NOTE — Patient Instructions (Addendum)
-   doing well from hive and swelling standpoint.  - at this time etiology of hives and swelling is most likely spontaneous in nature.  Hives can be caused by a variety of different triggers including illness/infection, foods, medications, stings, exercise, pressure, vibrations, extremes of temperature to name a few however majority of the time there is no identifiable trigger.    - you have had a reassuring workup thus far including blood cell counts, liver and kidney function, hereditary swelling panel, inflammatory marker, thyroid studies.    - environmental allergy panel was positive to dust mites, cockroach and tree pollen.  Avoidance measures provided.   - pork IgE was positive but low and may indicate you are sensitized (see below).  If you have not had symptoms after eating pork products then you are sensitized and can keep in diet.  If you have had symptoms after eating pork then you are allergic and should keep out of diet and have access to epipen.    - we have discussed the following in regards to foods:   Allergy: food allergy is when you have eaten a food, developed an allergic reaction after eating the food and have IgE to the food (positive food testing either by skin testing or blood testing).  Food allergy could lead to life threatening symptoms  Sensitivity: occurs when you have IgE to a food (positive food testing either by skin testing or blood testing) but is a food you eat without any issues.  This is not an allergy and we recommend keeping the food in the diet  Intolerance: this is when you have negative testing by either skin testing or blood testing thus not allergic but the food causes symptoms (like belly pain, bloating, diarrhea etc) with ingestion.  These foods should be avoided to prevent symptoms.    - at this time continue with Zyrtec 1 tab daily.   If you have return of itch, hives or swelling then add back in Famotidine 1 tab twice a day dosing.   - itchy rash on left  ear likely eczematous in nature.  Provided with Opzelura samples today to use thin layer on ear that it red, bumpy or itchy twice a day as needed.    Follow-up in 6 months or sooner if needed

## 2023-08-02 DIAGNOSIS — J309 Allergic rhinitis, unspecified: Secondary | ICD-10-CM | POA: Diagnosis not present

## 2023-08-02 DIAGNOSIS — Z0000 Encounter for general adult medical examination without abnormal findings: Secondary | ICD-10-CM | POA: Diagnosis not present

## 2023-08-02 DIAGNOSIS — I10 Essential (primary) hypertension: Secondary | ICD-10-CM | POA: Diagnosis not present

## 2023-08-02 DIAGNOSIS — E7800 Pure hypercholesterolemia, unspecified: Secondary | ICD-10-CM | POA: Diagnosis not present

## 2023-08-02 DIAGNOSIS — R600 Localized edema: Secondary | ICD-10-CM | POA: Diagnosis not present

## 2023-08-02 DIAGNOSIS — M8580 Other specified disorders of bone density and structure, unspecified site: Secondary | ICD-10-CM | POA: Diagnosis not present

## 2023-08-02 DIAGNOSIS — E119 Type 2 diabetes mellitus without complications: Secondary | ICD-10-CM | POA: Diagnosis not present

## 2023-08-21 DIAGNOSIS — D485 Neoplasm of uncertain behavior of skin: Secondary | ICD-10-CM | POA: Diagnosis not present

## 2023-08-28 DIAGNOSIS — B0089 Other herpesviral infection: Secondary | ICD-10-CM | POA: Diagnosis not present

## 2023-09-19 DIAGNOSIS — L2989 Other pruritus: Secondary | ICD-10-CM | POA: Diagnosis not present

## 2023-09-19 DIAGNOSIS — B0089 Other herpesviral infection: Secondary | ICD-10-CM | POA: Diagnosis not present

## 2023-09-20 ENCOUNTER — Ambulatory Visit (INDEPENDENT_AMBULATORY_CARE_PROVIDER_SITE_OTHER): Payer: Medicare HMO | Admitting: Otolaryngology

## 2023-09-27 DIAGNOSIS — I10 Essential (primary) hypertension: Secondary | ICD-10-CM | POA: Diagnosis not present

## 2023-09-27 DIAGNOSIS — E1165 Type 2 diabetes mellitus with hyperglycemia: Secondary | ICD-10-CM | POA: Diagnosis not present

## 2023-09-27 DIAGNOSIS — E119 Type 2 diabetes mellitus without complications: Secondary | ICD-10-CM | POA: Diagnosis not present

## 2023-09-27 DIAGNOSIS — J40 Bronchitis, not specified as acute or chronic: Secondary | ICD-10-CM | POA: Diagnosis not present

## 2023-09-28 ENCOUNTER — Ambulatory Visit: Admitting: Podiatry

## 2023-09-29 DIAGNOSIS — E119 Type 2 diabetes mellitus without complications: Secondary | ICD-10-CM | POA: Diagnosis not present

## 2023-09-29 DIAGNOSIS — I10 Essential (primary) hypertension: Secondary | ICD-10-CM | POA: Diagnosis not present

## 2023-09-29 DIAGNOSIS — J40 Bronchitis, not specified as acute or chronic: Secondary | ICD-10-CM | POA: Diagnosis not present

## 2023-09-29 DIAGNOSIS — E1165 Type 2 diabetes mellitus with hyperglycemia: Secondary | ICD-10-CM | POA: Diagnosis not present

## 2023-10-03 ENCOUNTER — Ambulatory Visit (INDEPENDENT_AMBULATORY_CARE_PROVIDER_SITE_OTHER): Admitting: Podiatry

## 2023-10-03 ENCOUNTER — Encounter: Payer: Self-pay | Admitting: Podiatry

## 2023-10-03 DIAGNOSIS — E119 Type 2 diabetes mellitus without complications: Secondary | ICD-10-CM | POA: Diagnosis not present

## 2023-10-03 DIAGNOSIS — M79675 Pain in left toe(s): Secondary | ICD-10-CM | POA: Diagnosis not present

## 2023-10-03 DIAGNOSIS — B351 Tinea unguium: Secondary | ICD-10-CM

## 2023-10-03 DIAGNOSIS — M79674 Pain in right toe(s): Secondary | ICD-10-CM

## 2023-10-03 NOTE — Progress Notes (Signed)
 This patient returns to my office for at risk foot care.  This patient requires this care by a professional since this patient will be at risk due to having diabetes.  This patient is unable to cut nails herself since the patient cannot reach hernails.These nails are painful walking and wearing shoes.  This patient presents for at risk foot care today.  General Appearance  Alert, conversant and in no acute stress.  Vascular  Dorsalis pedis  pulses are palpable  bilaterally.  Posterior tibial pulses are absent B/L. Capillary return is within normal limits  bilaterally. Temperature is within normal limits  bilaterally.  Neurologic  Senn-Weinstein monofilament wire test within normal limits  bilaterally. Muscle power within normal limits bilaterally.  Nails Thick disfigured discolored nails with subungual debris  from hallux to fifth toes bilaterally. No evidence of bacterial infection or drainage bilaterally.  Orthopedic  No limitations of motion  feet .  No crepitus or effusions noted.  No bony pathology or digital deformities noted.  Skin  normotropic skin with no porokeratosis noted bilaterally.  No signs of infections or ulcers noted.     Onychomycosis  Pain in right toes  Pain in left toes  Consent was obtained for treatment procedures.   Mechanical debridement of nails 1-5  bilaterally performed with a nail nipper.  Filed with dremel without incident. Padded her diabetic insoles.   Return office visit    3 months                  Told patient to return for periodic foot care and evaluation due to potential at risk complications.   Ruffin Cotton DPM

## 2023-10-16 DIAGNOSIS — J40 Bronchitis, not specified as acute or chronic: Secondary | ICD-10-CM | POA: Diagnosis not present

## 2023-10-16 DIAGNOSIS — E119 Type 2 diabetes mellitus without complications: Secondary | ICD-10-CM | POA: Diagnosis not present

## 2023-10-16 DIAGNOSIS — Z1231 Encounter for screening mammogram for malignant neoplasm of breast: Secondary | ICD-10-CM | POA: Diagnosis not present

## 2023-10-16 DIAGNOSIS — I10 Essential (primary) hypertension: Secondary | ICD-10-CM | POA: Diagnosis not present

## 2023-10-16 DIAGNOSIS — E1165 Type 2 diabetes mellitus with hyperglycemia: Secondary | ICD-10-CM | POA: Diagnosis not present

## 2023-10-16 DIAGNOSIS — E7800 Pure hypercholesterolemia, unspecified: Secondary | ICD-10-CM | POA: Diagnosis not present

## 2023-10-16 LAB — HM MAMMOGRAPHY

## 2023-10-26 DIAGNOSIS — E119 Type 2 diabetes mellitus without complications: Secondary | ICD-10-CM | POA: Diagnosis not present

## 2023-10-26 DIAGNOSIS — I10 Essential (primary) hypertension: Secondary | ICD-10-CM | POA: Diagnosis not present

## 2023-10-26 DIAGNOSIS — E1165 Type 2 diabetes mellitus with hyperglycemia: Secondary | ICD-10-CM | POA: Diagnosis not present

## 2023-10-26 DIAGNOSIS — J40 Bronchitis, not specified as acute or chronic: Secondary | ICD-10-CM | POA: Diagnosis not present

## 2023-11-15 ENCOUNTER — Ambulatory Visit: Admitting: Podiatry

## 2023-11-16 DIAGNOSIS — E1165 Type 2 diabetes mellitus with hyperglycemia: Secondary | ICD-10-CM | POA: Diagnosis not present

## 2023-11-16 DIAGNOSIS — E119 Type 2 diabetes mellitus without complications: Secondary | ICD-10-CM | POA: Diagnosis not present

## 2023-11-16 DIAGNOSIS — J40 Bronchitis, not specified as acute or chronic: Secondary | ICD-10-CM | POA: Diagnosis not present

## 2023-11-16 DIAGNOSIS — I10 Essential (primary) hypertension: Secondary | ICD-10-CM | POA: Diagnosis not present

## 2023-11-16 DIAGNOSIS — E7800 Pure hypercholesterolemia, unspecified: Secondary | ICD-10-CM | POA: Diagnosis not present

## 2023-11-25 DIAGNOSIS — E119 Type 2 diabetes mellitus without complications: Secondary | ICD-10-CM | POA: Diagnosis not present

## 2023-11-25 DIAGNOSIS — I10 Essential (primary) hypertension: Secondary | ICD-10-CM | POA: Diagnosis not present

## 2023-11-25 DIAGNOSIS — J40 Bronchitis, not specified as acute or chronic: Secondary | ICD-10-CM | POA: Diagnosis not present

## 2023-11-25 DIAGNOSIS — E1165 Type 2 diabetes mellitus with hyperglycemia: Secondary | ICD-10-CM | POA: Diagnosis not present

## 2023-11-29 ENCOUNTER — Encounter: Admitting: Nurse Practitioner

## 2023-12-06 ENCOUNTER — Ambulatory Visit (INDEPENDENT_AMBULATORY_CARE_PROVIDER_SITE_OTHER): Admitting: Nurse Practitioner

## 2023-12-06 ENCOUNTER — Encounter: Payer: Self-pay | Admitting: Nurse Practitioner

## 2023-12-06 VITALS — BP 118/70 | HR 82 | Ht 67.5 in | Wt 151.0 lb

## 2023-12-06 DIAGNOSIS — R350 Frequency of micturition: Secondary | ICD-10-CM

## 2023-12-06 DIAGNOSIS — N760 Acute vaginitis: Secondary | ICD-10-CM | POA: Diagnosis not present

## 2023-12-06 LAB — URINALYSIS, COMPLETE W/RFL CULTURE
Bacteria, UA: NONE SEEN /HPF
Bilirubin Urine: NEGATIVE
Hgb urine dipstick: NEGATIVE
Hyaline Cast: NONE SEEN /LPF
Ketones, ur: NEGATIVE
Leukocyte Esterase: NEGATIVE
Nitrites, Initial: NEGATIVE
Protein, ur: NEGATIVE
RBC / HPF: NONE SEEN /HPF (ref 0–2)
Specific Gravity, Urine: 1.01 (ref 1.001–1.035)
WBC, UA: NONE SEEN /HPF (ref 0–5)
pH: 5.5 (ref 5.0–8.0)

## 2023-12-06 LAB — NO CULTURE INDICATED

## 2023-12-06 LAB — WET PREP FOR TRICH, YEAST, CLUE

## 2023-12-06 MED ORDER — FLUCONAZOLE 150 MG PO TABS: 150.0000 mg | ORAL_TABLET | ORAL | 0 refills | Status: DC

## 2023-12-06 NOTE — Progress Notes (Signed)
   Acute Office Visit  Subjective:    Patient ID: Kelly Quinn, female    DOB: June 09, 1945, 78 y.o.   MRN: [id number]   HPI 78 y.o. presents today for itching, irritation, swelling, burning, and frequent urination x 1 month. Symptoms are external. Started Jardiance in April.   No LMP recorded. Patient is postmenopausal.    Review of Systems  Constitutional: Negative.   Genitourinary:  Positive for frequency and vaginal pain (Vulvar itching, burning). Negative for dysuria, flank pain, genital sores, hematuria, urgency and vaginal discharge.       Objective:    Physical Exam  BP 118/70 (BP Location: Left Arm, Patient Position: Sitting, Cuff Size: Normal)   Pulse 82   Ht 5' 7.5 (1.715 m)   Wt 151 lb (68.5 kg)   SpO2 97%   BMI 23.30 kg/m  Wt Readings from Last 3 Encounters:  12/06/23 151 lb (68.5 kg)  07/10/23 163 lb (73.9 kg)  06/02/23 163 lb (73.9 kg)        Dereck Keas, CMA present as Biomedical engineer.   Wet prep negative for pathogens  UA negative  Assessment & Plan:   Problem List Items Addressed This Visit   None Visit Diagnoses       Acute vaginitis    -  Primary   Relevant Medications   fluconazole  (DIFLUCAN ) 150 MG tablet   Other Relevant Orders   WET PREP FOR TRICH, YEAST, CLUE     Urinary frequency       Relevant Orders   Urinalysis,Complete w/RFL Culture      Plan: Wet prep and UA negative. Diflucan  150 mg today and repeat in 3 days if symptoms persist. Discussed Jardiance and increased risk for yeast infections with use.   Return if symptoms worsen or fail to improve.    Annabella DELENA Shutter DNP, 11:25 AM 12/06/2023

## 2023-12-17 DIAGNOSIS — E7800 Pure hypercholesterolemia, unspecified: Secondary | ICD-10-CM | POA: Diagnosis not present

## 2023-12-17 DIAGNOSIS — E119 Type 2 diabetes mellitus without complications: Secondary | ICD-10-CM | POA: Diagnosis not present

## 2023-12-17 DIAGNOSIS — E1165 Type 2 diabetes mellitus with hyperglycemia: Secondary | ICD-10-CM | POA: Diagnosis not present

## 2023-12-17 DIAGNOSIS — J40 Bronchitis, not specified as acute or chronic: Secondary | ICD-10-CM | POA: Diagnosis not present

## 2023-12-17 DIAGNOSIS — I10 Essential (primary) hypertension: Secondary | ICD-10-CM | POA: Diagnosis not present

## 2023-12-25 DIAGNOSIS — J40 Bronchitis, not specified as acute or chronic: Secondary | ICD-10-CM | POA: Diagnosis not present

## 2023-12-25 DIAGNOSIS — I10 Essential (primary) hypertension: Secondary | ICD-10-CM | POA: Diagnosis not present

## 2023-12-25 DIAGNOSIS — E119 Type 2 diabetes mellitus without complications: Secondary | ICD-10-CM | POA: Diagnosis not present

## 2023-12-25 DIAGNOSIS — E1165 Type 2 diabetes mellitus with hyperglycemia: Secondary | ICD-10-CM | POA: Diagnosis not present

## 2023-12-26 DIAGNOSIS — E119 Type 2 diabetes mellitus without complications: Secondary | ICD-10-CM | POA: Diagnosis not present

## 2023-12-26 DIAGNOSIS — R634 Abnormal weight loss: Secondary | ICD-10-CM | POA: Diagnosis not present

## 2023-12-26 DIAGNOSIS — L659 Nonscarring hair loss, unspecified: Secondary | ICD-10-CM | POA: Diagnosis not present

## 2024-01-02 DIAGNOSIS — M8589 Other specified disorders of bone density and structure, multiple sites: Secondary | ICD-10-CM | POA: Diagnosis not present

## 2024-01-03 ENCOUNTER — Encounter: Payer: Self-pay | Admitting: Podiatry

## 2024-01-03 ENCOUNTER — Ambulatory Visit (INDEPENDENT_AMBULATORY_CARE_PROVIDER_SITE_OTHER): Admitting: Podiatry

## 2024-01-03 DIAGNOSIS — M79674 Pain in right toe(s): Secondary | ICD-10-CM | POA: Diagnosis not present

## 2024-01-03 DIAGNOSIS — M79675 Pain in left toe(s): Secondary | ICD-10-CM | POA: Diagnosis not present

## 2024-01-03 DIAGNOSIS — E119 Type 2 diabetes mellitus without complications: Secondary | ICD-10-CM

## 2024-01-03 DIAGNOSIS — B351 Tinea unguium: Secondary | ICD-10-CM

## 2024-01-03 NOTE — Progress Notes (Signed)
 This patient returns to my office for at risk foot care.  This patient requires this care by a professional since this patient will be at risk due to having diabetes.  This patient is unable to cut nails herself since the patient cannot reach hernails.These nails are painful walking and wearing shoes.  This patient presents for at risk foot care today.  General Appearance  Alert, conversant and in no acute stress.  Vascular  Dorsalis pedis  pulses are palpable  bilaterally.  Posterior tibial pulses are absent B/L. Capillary return is within normal limits  bilaterally. Temperature is within normal limits  bilaterally.  Neurologic  Senn-Weinstein monofilament wire test within normal limits  bilaterally. Muscle power within normal limits bilaterally.  Nails Thick disfigured discolored nails with subungual debris  from hallux to fifth toes bilaterally. No evidence of bacterial infection or drainage bilaterally.  Orthopedic  No limitations of motion  feet .  No crepitus or effusions noted.  No bony pathology or digital deformities noted.  Skin  normotropic skin with no porokeratosis noted bilaterally.  No signs of infections or ulcers noted.     Onychomycosis  Pain in right toes  Pain in left toes  Consent was obtained for treatment procedures.   Mechanical debridement of nails 1-5  bilaterally performed with a nail nipper.  Filed with dremel without incident. Padded her diabetic insoles.   Return office visit   10 weeks                 Told patient to return for periodic foot care and evaluation due to potential at risk complications.   Cordella Bold DPM

## 2024-01-03 NOTE — Progress Notes (Unsigned)
   Kelly Quinn 04-Nov-1945 [id number]   History:  78 y.o. G0 presents for breast and pelvic exam. Postmenopausal - no HRT, no bleeding. Normal pap history. History of osteopenia, managed by PCP. Took Fosamax for a year but did not tolerate.   Gynecologic History No LMP recorded. Patient is postmenopausal.   Contraception/Family planning: post menopausal status Sexually active: No  Health Maintenance Last Pap: 10/05/2012. Results were: Normal Last mammogram: 09/2021 per patient. Results were: Diagnostic normal Last colonoscopy: 10/04/2021. Results were: Tubular adenoma, 3-year repeat Last Dexa: 09/15/2017. Results were: T-score -2.2 FRAX 7.1% / 1.2%  Past medical history, past surgical history, family history and social history were all reviewed and documented in the EPIC chart. Widowed. Adopted daughter with 3 children ages 43, 1, and 6.   ROS:  A ROS was performed and pertinent positives and negatives are included.  Exam:  There were no vitals filed for this visit.    There is no height or weight on file to calculate BMI.  General appearance:  Normal Thyroid :  Symmetrical, normal in size, without palpable masses or nodularity. Respiratory  Auscultation:  Clear without wheezing or rhonchi Cardiovascular  Auscultation:  Regular rate, without rubs, murmurs or gallops  Edema/varicosities:  Not grossly evident Abdominal  Soft,nontender, without masses, guarding or rebound.  Liver/spleen:  No organomegaly noted  Hernia:  None appreciated  Skin  Inspection:  Grossly normal   Breasts: Examined lying and sitting.   Right: Without masses, retractions, discharge or axillary adenopathy.   Left: Without masses, retractions, discharge or axillary adenopathy. Pelvic: External genitalia:  no lesions              Urethra:  normal appearing urethra with no masses, tenderness or lesions              Bartholins and Skenes: normal                 Vagina: normal appearing vagina with  normal color and discharge, no lesions. Atrophic changes              Cervix: no lesions Bimanual Exam:  Uterus:  no masses or tenderness              Adnexa: no mass, fullness, tenderness              Rectovaginal: Deferred              Anus:  normal, no lesions   Assessment/Plan:  78 y.o. G for breast and pelvic exam.   Well female exam with routine gynecological exam - Education provided on SBEs, importance of preventative screenings, current guidelines, high calcium diet, regular exercise, and multivitamin daily. Labs with PCP.   Osteopenia of multiple sites - Continue Vitamin D + calcium supplement, regular exercise and weightbearing exercises. PCP manages this. She was on Fosamax x 1 year but did not tolerated. Recommend repeating Dexa and she will follow up with PCP about this.   Postmenopausal - no HRT, no bleeding  Screening for cervical cancer - Normal Pap history. No longer screening per guidelines.   Screening for breast cancer - Normal mammogram history.  Continue annual screenings.  Normal breast exam today.  Screening for colon cancer -Colonoscopy 09/2021, tubular adenoma. Will repeat at 3-year interval per GI's recommendation.   No follow-ups on file.       Annabella DELENA Shutter [facility], 4:08 PM 01/03/2024

## 2024-01-04 ENCOUNTER — Encounter: Payer: Self-pay | Admitting: Nurse Practitioner

## 2024-01-04 ENCOUNTER — Ambulatory Visit (INDEPENDENT_AMBULATORY_CARE_PROVIDER_SITE_OTHER): Admitting: Nurse Practitioner

## 2024-01-04 VITALS — BP 120/68 | HR 74 | Resp 16 | Ht 66.75 in | Wt 153.0 lb

## 2024-01-04 DIAGNOSIS — Z01419 Encounter for gynecological examination (general) (routine) without abnormal findings: Secondary | ICD-10-CM

## 2024-01-04 DIAGNOSIS — M8589 Other specified disorders of bone density and structure, multiple sites: Secondary | ICD-10-CM

## 2024-01-04 DIAGNOSIS — Z780 Asymptomatic menopausal state: Secondary | ICD-10-CM

## 2024-01-10 DIAGNOSIS — R0981 Nasal congestion: Secondary | ICD-10-CM | POA: Diagnosis not present

## 2024-01-10 DIAGNOSIS — J309 Allergic rhinitis, unspecified: Secondary | ICD-10-CM | POA: Diagnosis not present

## 2024-01-10 DIAGNOSIS — H6123 Impacted cerumen, bilateral: Secondary | ICD-10-CM | POA: Diagnosis not present

## 2024-01-16 DIAGNOSIS — E119 Type 2 diabetes mellitus without complications: Secondary | ICD-10-CM | POA: Diagnosis not present

## 2024-01-16 DIAGNOSIS — E7800 Pure hypercholesterolemia, unspecified: Secondary | ICD-10-CM | POA: Diagnosis not present

## 2024-01-16 DIAGNOSIS — I10 Essential (primary) hypertension: Secondary | ICD-10-CM | POA: Diagnosis not present

## 2024-01-16 DIAGNOSIS — E1165 Type 2 diabetes mellitus with hyperglycemia: Secondary | ICD-10-CM | POA: Diagnosis not present

## 2024-01-16 DIAGNOSIS — J40 Bronchitis, not specified as acute or chronic: Secondary | ICD-10-CM | POA: Diagnosis not present

## 2024-01-18 DIAGNOSIS — R590 Localized enlarged lymph nodes: Secondary | ICD-10-CM | POA: Diagnosis not present

## 2024-01-18 DIAGNOSIS — L219 Seborrheic dermatitis, unspecified: Secondary | ICD-10-CM | POA: Diagnosis not present

## 2024-01-18 DIAGNOSIS — L299 Pruritus, unspecified: Secondary | ICD-10-CM | POA: Diagnosis not present

## 2024-01-18 DIAGNOSIS — L658 Other specified nonscarring hair loss: Secondary | ICD-10-CM | POA: Diagnosis not present

## 2024-01-24 DIAGNOSIS — J40 Bronchitis, not specified as acute or chronic: Secondary | ICD-10-CM | POA: Diagnosis not present

## 2024-01-24 DIAGNOSIS — E119 Type 2 diabetes mellitus without complications: Secondary | ICD-10-CM | POA: Diagnosis not present

## 2024-01-24 DIAGNOSIS — E1165 Type 2 diabetes mellitus with hyperglycemia: Secondary | ICD-10-CM | POA: Diagnosis not present

## 2024-01-24 DIAGNOSIS — I10 Essential (primary) hypertension: Secondary | ICD-10-CM | POA: Diagnosis not present

## 2024-01-25 ENCOUNTER — Ambulatory Visit: Admitting: Allergy

## 2024-01-25 ENCOUNTER — Encounter: Admitting: Nurse Practitioner

## 2024-01-25 DIAGNOSIS — R600 Localized edema: Secondary | ICD-10-CM | POA: Diagnosis not present

## 2024-01-25 DIAGNOSIS — H6123 Impacted cerumen, bilateral: Secondary | ICD-10-CM | POA: Diagnosis not present

## 2024-01-25 DIAGNOSIS — H90A21 Sensorineural hearing loss, unilateral, right ear, with restricted hearing on the contralateral side: Secondary | ICD-10-CM | POA: Diagnosis not present

## 2024-01-26 DIAGNOSIS — L720 Epidermal cyst: Secondary | ICD-10-CM | POA: Diagnosis not present

## 2024-01-26 DIAGNOSIS — L821 Other seborrheic keratosis: Secondary | ICD-10-CM | POA: Diagnosis not present

## 2024-01-31 DIAGNOSIS — R600 Localized edema: Secondary | ICD-10-CM | POA: Diagnosis not present

## 2024-02-07 ENCOUNTER — Other Ambulatory Visit: Payer: Self-pay

## 2024-02-07 ENCOUNTER — Ambulatory Visit (INDEPENDENT_AMBULATORY_CARE_PROVIDER_SITE_OTHER): Admitting: Allergy

## 2024-02-07 ENCOUNTER — Encounter: Payer: Self-pay | Admitting: Allergy

## 2024-02-07 VITALS — BP 118/72 | HR 87 | Temp 98.3°F | Resp 18 | Ht 67.0 in | Wt 154.0 lb

## 2024-02-07 DIAGNOSIS — H01131 Eczematous dermatitis of right upper eyelid: Secondary | ICD-10-CM

## 2024-02-07 DIAGNOSIS — J309 Allergic rhinitis, unspecified: Secondary | ICD-10-CM | POA: Diagnosis not present

## 2024-02-07 DIAGNOSIS — H1013 Acute atopic conjunctivitis, bilateral: Secondary | ICD-10-CM | POA: Diagnosis not present

## 2024-02-07 DIAGNOSIS — K115 Sialolithiasis: Secondary | ICD-10-CM | POA: Diagnosis not present

## 2024-02-07 DIAGNOSIS — L508 Other urticaria: Secondary | ICD-10-CM | POA: Diagnosis not present

## 2024-02-07 DIAGNOSIS — H1010 Acute atopic conjunctivitis, unspecified eye: Secondary | ICD-10-CM

## 2024-02-07 NOTE — Progress Notes (Signed)
 Follow-up Note  RE: Kelly Quinn MRN: [id number] DOB: [birth year] Date of Office Visit: 02/07/2024   History of present illness: Kelly Quinn is a 78 y.o. female presenting today for follow-up of urticaria.  She was last seen in the office on 07/28/2023 by myself. Discussed the use of AI scribe software for clinical note transcription with the patient, who gave verbal consent to proceed.  She has a history of salivary stones, first occurring in 2014, which required surgical removal. Recently, she noticed a recurrence while brushing her teeth, initially reported the size of the neck mass was about the size of an avocado seed, which has since reduced.  He is following with ENT at this time for management.  She experiences dry mouth, with her lips sometimes sticking together; sometimes she takes a sip of water and it goes away. A CT scan was performed last Thursday, and she is awaiting results.  She has a history of tinnitus and underwent a hearing test, which revealed an issue in the right ear. She was informed it could be due to various causes.  This is also an ongoing issue with the ENT.  Regarding allergy-related symptoms, she has not experienced any hive episodes since spring. However, she reports a recurring rash on her eyelids, particularly in the location where eyeliner is usually applied, which itches slightly. She uses petroleum jelly to alleviate the itching. She takes Zyrtec  daily for allergies. She does her own  mowing her yard while wearing protective gear, including a mask and goggles, to minimize exposure to allergens. She has not noticed a direct correlation between outdoor activity and the eyelid rash.      Review of systems: 10pt ROS negative unless noted above in HPI  Past medical/social/surgical/family history have been reviewed and are unchanged unless specifically indicated below.  No changes  Medication List: Current Outpatient Medications  Medication Sig  Dispense Refill   CALCIUM PO Take 2,000 mg by mouth daily.     cetirizine  (ZYRTEC ) 10 MG tablet Take 1 tablet (10 mg total) by mouth 2 (two) times daily. 60 tablet 5   COLLAGEN PO Take 1 tablet by mouth daily.     Cyanocobalamin (VITAMIN B 12 PO) Take 5,000 mcg by mouth daily. Take 2 gummies daily     cyclobenzaprine (FLEXERIL) 10 MG tablet Oral     famciclovir (FAMVIR) 125 MG tablet Take 125 mg by mouth as needed.       finasteride (PROSCAR) 5 MG tablet Take by mouth.     fluocinonide ointment (LIDEX) 0.05 % Apply 1 Application topically as needed (itching).     fluticasone (FLONASE) 50 MCG/ACT nasal spray 2 sprays by Nasal route as needed.       hydrochlorothiazide (HYDRODIURIL) 12.5 MG tablet Take 12.5 mg by mouth as needed.     Ivermectin 1 % CREA Apply 1 Application topically daily.     JARDIANCE 10 MG TABS tablet      Multiple Vitamin (MULTIVITAMIN) tablet Take 1 tablet by mouth daily.     NONFORMULARY OR COMPOUNDED ITEM Boric Acid 600 mg. Capsules #30 s: insert caps vaginally twice weekly. 30 each 1   rosuvastatin (CRESTOR) 10 MG tablet Take by mouth.     Vitamin D-Vitamin K (VITAMIN K2-VITAMIN D3 PO) Take 2,000 mg by mouth daily.     EPINEPHrine  (EPIPEN  2-PAK) 0.3 mg/0.3 mL IJ SOAJ injection Inject 0.3 mg into the muscle as needed for anaphylaxis. (Patient not taking: Reported on 02/07/2024) 2  each 2   Multiple Vitamins-Minerals (IMMUNE SUPPORT) CHEW Chew 1 tablet by mouth daily. (Patient not taking: Reported on 02/07/2024)     Ruxolitinib Phosphate  (OPZELURA ) 1.5 % CREA Use a thin layer on ear that is red, bumpy, or itchy twice a day as needed. (Patient not taking: Reported on 12/06/2023)     TURMERIC PO Take 1 capsule by mouth daily. (Patient not taking: Reported on 02/07/2024)     No current facility-administered medications for this visit.     Known medication allergies: Allergies  Allergen Reactions   Asa [Aspirin]     Swelling in lips   Antiseptic Products, Misc.      Clorox and Lysol stops pt up/congestion/cough   Codeine     Nauseous, stomach pain   Darvon     Upset stomach   Levofloxacin Hives   Clarithromycin     Other Reaction(s): Unknown   Other Dermatitis    Dawn dish soap in peach, SPF/Sun     Physical examination: Blood pressure 118/72, pulse 87, temperature 98.3 F (36.8 C), temperature source Temporal, resp. rate 18, height 5' 7 (1.702 m), weight 154 lb (69.9 kg), SpO2 99%.  General: Alert, interactive, in no acute distress. HEENT: PERRLA, TMs pearly gray, turbinates non-edematous without discharge, post-pharynx non erythematous. Neck: Supple without lymphadenopathy. Lungs: Clear to auscultation without wheezing, rhonchi or rales. {no increased work of breathing. CV: Normal S1, S2 without murmurs. Abdomen: Nondistended, nontender. Skin: Warm and dry, without lesions or rashes. Extremities:  No clubbing, cyanosis or edema. Neuro:   Grossly intact.  Diagnostics/Labs: None today  Assessment and plan: Chronic spontaneous urticaria  - doing well from hive and swelling standpoint with no recent episodes! - at this time etiology of hives and swelling is spontaneous in nature.  Hives can be caused by a variety of different triggers including illness/infection, foods, medications, stings, exercise, pressure, vibrations, extremes of temperature to name a few however majority of the time there is no identifiable trigger.    - you have had a reassuring workup thus far including blood cell counts, liver and kidney function, hereditary swelling panel, inflammatory marker, thyroid  studies.    - environmental allergy panel was positive to dust mites, cockroach and tree pollen.  Avoidance measures provided.  - at this time recommend going to every other day dosing of Zyrtec  10mg  (1 tab).  Hoping every other day dosing will help reduce the mouth dryness .  If you have return of itch, hives or swelling then go back to daily Zyrtec  and consider adding  back in Famotidine  1 tab twice a day dosing.   Allergic rhinitis with conjunctivitis -Avoidance measures in place for dust mites, cockroach and tree pollen. -As above we will move Zyrtec  to every other day dosing at this time  Eyelid dermatitis -Zyrtec  as above -Continue moisturizing the eyelid with petroleum jelly as needed  Salivary stone -Likely contributing to her dry mouth with likely an obstructive process in her salivary gland -Following  with ENT for management.  Follow-up in 6-12 months or sooner if needed  I appreciate the opportunity to take part in Destinie's care. Please do not hesitate to contact me with questions.  Sincerely,    Kelly Brain, MD Allergy/Immunology [facility]

## 2024-02-07 NOTE — Patient Instructions (Addendum)
-   doing well from hive and swelling standpoint with no recent episodes! - at this time etiology of hives and swelling is spontaneous in nature.  Hives can be caused by a variety of different triggers including illness/infection, foods, medications, stings, exercise, pressure, vibrations, extremes of temperature to name a few however majority of the time there is no identifiable trigger.    - you have had a reassuring workup thus far including blood cell counts, liver and kidney function, hereditary swelling panel, inflammatory marker, thyroid  studies.    - environmental allergy panel was positive to dust mites, cockroach and tree pollen.  Avoidance measures provided.  - at this time recommend going to every other day dosing of Zyrtec  10mg  (1 tab).   If you have return of itch, hives or swelling then go back to daily Zyrtec  and consider adding back in Famotidine  1 tab twice a day dosing.    Follow-up in 6-12 months or sooner if needed

## 2024-02-12 ENCOUNTER — Other Ambulatory Visit (HOSPITAL_COMMUNITY): Payer: Self-pay | Admitting: Otolaryngology

## 2024-02-12 DIAGNOSIS — H90A21 Sensorineural hearing loss, unilateral, right ear, with restricted hearing on the contralateral side: Secondary | ICD-10-CM

## 2024-02-16 DIAGNOSIS — E119 Type 2 diabetes mellitus without complications: Secondary | ICD-10-CM | POA: Diagnosis not present

## 2024-02-16 DIAGNOSIS — J40 Bronchitis, not specified as acute or chronic: Secondary | ICD-10-CM | POA: Diagnosis not present

## 2024-02-16 DIAGNOSIS — E7800 Pure hypercholesterolemia, unspecified: Secondary | ICD-10-CM | POA: Diagnosis not present

## 2024-02-16 DIAGNOSIS — I10 Essential (primary) hypertension: Secondary | ICD-10-CM | POA: Diagnosis not present

## 2024-02-16 DIAGNOSIS — E1165 Type 2 diabetes mellitus with hyperglycemia: Secondary | ICD-10-CM | POA: Diagnosis not present

## 2024-02-19 DIAGNOSIS — L659 Nonscarring hair loss, unspecified: Secondary | ICD-10-CM | POA: Diagnosis not present

## 2024-02-19 DIAGNOSIS — R600 Localized edema: Secondary | ICD-10-CM | POA: Diagnosis not present

## 2024-02-19 DIAGNOSIS — J309 Allergic rhinitis, unspecified: Secondary | ICD-10-CM | POA: Diagnosis not present

## 2024-02-19 DIAGNOSIS — L508 Other urticaria: Secondary | ICD-10-CM | POA: Diagnosis not present

## 2024-02-19 DIAGNOSIS — E7800 Pure hypercholesterolemia, unspecified: Secondary | ICD-10-CM | POA: Diagnosis not present

## 2024-02-19 DIAGNOSIS — E119 Type 2 diabetes mellitus without complications: Secondary | ICD-10-CM | POA: Diagnosis not present

## 2024-02-19 DIAGNOSIS — I10 Essential (primary) hypertension: Secondary | ICD-10-CM | POA: Diagnosis not present

## 2024-02-23 ENCOUNTER — Ambulatory Visit (HOSPITAL_COMMUNITY)
Admission: RE | Admit: 2024-02-23 | Discharge: 2024-02-23 | Disposition: A | Discharge status: Home / Self Care | Source: Ambulatory Visit | Attending: Otolaryngology | Admitting: Otolaryngology

## 2024-02-23 DIAGNOSIS — I10 Essential (primary) hypertension: Secondary | ICD-10-CM | POA: Diagnosis not present

## 2024-02-23 DIAGNOSIS — E119 Type 2 diabetes mellitus without complications: Secondary | ICD-10-CM | POA: Diagnosis not present

## 2024-02-23 DIAGNOSIS — H90A21 Sensorineural hearing loss, unilateral, right ear, with restricted hearing on the contralateral side: Secondary | ICD-10-CM | POA: Insufficient documentation

## 2024-02-23 DIAGNOSIS — J40 Bronchitis, not specified as acute or chronic: Secondary | ICD-10-CM | POA: Diagnosis not present

## 2024-02-23 DIAGNOSIS — E1165 Type 2 diabetes mellitus with hyperglycemia: Secondary | ICD-10-CM | POA: Diagnosis not present

## 2024-02-23 MED ORDER — GADOBUTROL 1 MMOL/ML IV SOLN
7.0000 mL | Freq: Once | INTRAVENOUS | Status: AC | PRN
  Administered 2024-02-23: 7 mL via INTRAVENOUS

## 2024-02-27 DIAGNOSIS — K115 Sialolithiasis: Secondary | ICD-10-CM | POA: Diagnosis not present

## 2024-03-17 DIAGNOSIS — E7800 Pure hypercholesterolemia, unspecified: Secondary | ICD-10-CM | POA: Diagnosis not present

## 2024-03-17 DIAGNOSIS — J40 Bronchitis, not specified as acute or chronic: Secondary | ICD-10-CM | POA: Diagnosis not present

## 2024-03-17 DIAGNOSIS — E1165 Type 2 diabetes mellitus with hyperglycemia: Secondary | ICD-10-CM | POA: Diagnosis not present

## 2024-03-17 DIAGNOSIS — I10 Essential (primary) hypertension: Secondary | ICD-10-CM | POA: Diagnosis not present

## 2024-03-17 DIAGNOSIS — E119 Type 2 diabetes mellitus without complications: Secondary | ICD-10-CM | POA: Diagnosis not present

## 2024-03-22 ENCOUNTER — Ambulatory Visit: Admitting: Podiatry

## 2024-03-26 ENCOUNTER — Ambulatory Visit: Admitting: Podiatry

## 2024-03-26 DIAGNOSIS — E119 Type 2 diabetes mellitus without complications: Secondary | ICD-10-CM

## 2024-03-26 DIAGNOSIS — B351 Tinea unguium: Secondary | ICD-10-CM

## 2024-03-26 NOTE — Progress Notes (Signed)
 This patient returns to my office for at risk foot care.  This patient requires this care by a professional since this patient will be at risk due to having diabetes.  This patient is unable to cut nails herself since the patient cannot reach hernails.These nails are painful walking and wearing shoes.  This patient presents for at risk foot care today.  General Appearance  Alert, conversant and in no acute stress.  Vascular  Dorsalis pedis  pulses are palpable  bilaterally.  Posterior tibial pulses are absent B/L. Capillary return is within normal limits  bilaterally. Temperature is within normal limits  bilaterally.  Neurologic  Senn-Weinstein monofilament wire test within normal limits  bilaterally. Muscle power within normal limits bilaterally.  Nails Thick disfigured discolored nails with subungual debris  from hallux to fifth toes bilaterally. No evidence of bacterial infection or drainage bilaterally.  Orthopedic  No limitations of motion  feet .  No crepitus or effusions noted.  No bony pathology or digital deformities noted.  Skin  normotropic skin with no porokeratosis noted bilaterally.  No signs of infections or ulcers noted.     Onychomycosis  Pain in right toes  Pain in left toes  Consent was obtained for treatment procedures.   Mechanical debridement of nails 1-5  bilaterally performed with a nail nipper.  Filed with dremel without incident. Padded her diabetic insoles.   Return office visit   10 weeks                 Told patient to return for periodic foot care and evaluation due to potential at risk complications.   Cordella Bold DPM

## 2024-04-25 ENCOUNTER — Other Ambulatory Visit: Payer: Self-pay

## 2024-04-25 NOTE — Telephone Encounter (Signed)
 Patient left voice message on refill line requesting medication refill  Med refill request: fluconazole  150 mg tab Last AEX: 01/04/2024 TW Next AEX: not yet scheduled Last MMG (if hormonal med) 10/16/2023 Refill authorized: Please Advise? Last Rx sent #2 with zero refills on 12/06/2023 TW

## 2024-04-26 MED ORDER — FLUCONAZOLE 150 MG PO TABS: 150.0000 mg | ORAL_TABLET | Freq: Once | ORAL | 0 refills | Status: AC

## 2024-06-04 ENCOUNTER — Ambulatory Visit: Admitting: Podiatry

## 2025-01-09 ENCOUNTER — Ambulatory Visit: Admitting: Allergy
